# Patient Record
Sex: Female | Born: 1955 | Race: White | Hispanic: No | Smoking: Former smoker
Health system: Southern US, Community
[De-identification: ages and names within clinical notes are randomized; demographics above are authoritative.]

## PROBLEM LIST (undated history)

## (undated) DIAGNOSIS — J45909 Unspecified asthma, uncomplicated: Secondary | ICD-10-CM

## (undated) DIAGNOSIS — J449 Chronic obstructive pulmonary disease, unspecified: Secondary | ICD-10-CM

## (undated) DIAGNOSIS — B1920 Unspecified viral hepatitis C without hepatic coma: Secondary | ICD-10-CM

## (undated) DIAGNOSIS — K7460 Unspecified cirrhosis of liver: Secondary | ICD-10-CM

## (undated) HISTORY — PX: ABDOMINAL HYSTERECTOMY: SHX81

## (undated) HISTORY — PX: CHOLECYSTECTOMY: SHX55

## (undated) HISTORY — PX: KNEE SURGERY: SHX244

## (undated) HISTORY — PX: TONSILLECTOMY: SUR1361

---

## 2019-04-12 DIAGNOSIS — Z1331 Encounter for screening for depression: Secondary | ICD-10-CM | POA: Diagnosis not present

## 2019-04-12 DIAGNOSIS — F322 Major depressive disorder, single episode, severe without psychotic features: Secondary | ICD-10-CM | POA: Diagnosis not present

## 2019-04-12 DIAGNOSIS — Z7189 Other specified counseling: Secondary | ICD-10-CM | POA: Diagnosis not present

## 2019-04-12 DIAGNOSIS — I10 Essential (primary) hypertension: Secondary | ICD-10-CM | POA: Diagnosis not present

## 2019-04-12 DIAGNOSIS — M549 Dorsalgia, unspecified: Secondary | ICD-10-CM | POA: Diagnosis not present

## 2019-04-12 DIAGNOSIS — Z79899 Other long term (current) drug therapy: Secondary | ICD-10-CM | POA: Diagnosis not present

## 2019-04-12 DIAGNOSIS — R5383 Other fatigue: Secondary | ICD-10-CM | POA: Diagnosis not present

## 2019-04-12 DIAGNOSIS — Z1211 Encounter for screening for malignant neoplasm of colon: Secondary | ICD-10-CM | POA: Diagnosis not present

## 2019-04-12 DIAGNOSIS — J449 Chronic obstructive pulmonary disease, unspecified: Secondary | ICD-10-CM | POA: Diagnosis not present

## 2019-04-12 DIAGNOSIS — Z6823 Body mass index (BMI) 23.0-23.9, adult: Secondary | ICD-10-CM | POA: Diagnosis not present

## 2019-04-12 DIAGNOSIS — Z0000 Encounter for general adult medical examination without abnormal findings: Secondary | ICD-10-CM | POA: Diagnosis not present

## 2019-04-12 DIAGNOSIS — Z299 Encounter for prophylactic measures, unspecified: Secondary | ICD-10-CM | POA: Diagnosis not present

## 2019-04-12 DIAGNOSIS — Z1339 Encounter for screening examination for other mental health and behavioral disorders: Secondary | ICD-10-CM | POA: Diagnosis not present

## 2019-04-24 DIAGNOSIS — E2839 Other primary ovarian failure: Secondary | ICD-10-CM | POA: Diagnosis not present

## 2019-05-24 DIAGNOSIS — M810 Age-related osteoporosis without current pathological fracture: Secondary | ICD-10-CM | POA: Diagnosis not present

## 2019-05-24 DIAGNOSIS — Z299 Encounter for prophylactic measures, unspecified: Secondary | ICD-10-CM | POA: Diagnosis not present

## 2019-05-24 DIAGNOSIS — I10 Essential (primary) hypertension: Secondary | ICD-10-CM | POA: Diagnosis not present

## 2019-05-24 DIAGNOSIS — J449 Chronic obstructive pulmonary disease, unspecified: Secondary | ICD-10-CM | POA: Diagnosis not present

## 2019-05-24 DIAGNOSIS — Z6823 Body mass index (BMI) 23.0-23.9, adult: Secondary | ICD-10-CM | POA: Diagnosis not present

## 2019-05-24 DIAGNOSIS — F322 Major depressive disorder, single episode, severe without psychotic features: Secondary | ICD-10-CM | POA: Diagnosis not present

## 2019-07-29 DIAGNOSIS — Z23 Encounter for immunization: Secondary | ICD-10-CM | POA: Diagnosis not present

## 2019-08-06 DIAGNOSIS — M549 Dorsalgia, unspecified: Secondary | ICD-10-CM | POA: Diagnosis not present

## 2019-08-06 DIAGNOSIS — Z79899 Other long term (current) drug therapy: Secondary | ICD-10-CM | POA: Diagnosis not present

## 2019-08-06 DIAGNOSIS — J449 Chronic obstructive pulmonary disease, unspecified: Secondary | ICD-10-CM | POA: Diagnosis not present

## 2019-08-06 DIAGNOSIS — Z299 Encounter for prophylactic measures, unspecified: Secondary | ICD-10-CM | POA: Diagnosis not present

## 2019-08-06 DIAGNOSIS — Z6825 Body mass index (BMI) 25.0-25.9, adult: Secondary | ICD-10-CM | POA: Diagnosis not present

## 2019-08-06 DIAGNOSIS — I10 Essential (primary) hypertension: Secondary | ICD-10-CM | POA: Diagnosis not present

## 2019-10-05 DIAGNOSIS — R413 Other amnesia: Secondary | ICD-10-CM | POA: Diagnosis not present

## 2019-10-05 DIAGNOSIS — R42 Dizziness and giddiness: Secondary | ICD-10-CM | POA: Diagnosis not present

## 2019-11-05 DIAGNOSIS — R42 Dizziness and giddiness: Secondary | ICD-10-CM | POA: Diagnosis not present

## 2019-11-08 DIAGNOSIS — Z299 Encounter for prophylactic measures, unspecified: Secondary | ICD-10-CM | POA: Diagnosis not present

## 2019-11-08 DIAGNOSIS — Z6825 Body mass index (BMI) 25.0-25.9, adult: Secondary | ICD-10-CM | POA: Diagnosis not present

## 2019-11-08 DIAGNOSIS — R413 Other amnesia: Secondary | ICD-10-CM | POA: Diagnosis not present

## 2019-11-08 DIAGNOSIS — M549 Dorsalgia, unspecified: Secondary | ICD-10-CM | POA: Diagnosis not present

## 2019-11-08 DIAGNOSIS — J449 Chronic obstructive pulmonary disease, unspecified: Secondary | ICD-10-CM | POA: Diagnosis not present

## 2019-11-08 DIAGNOSIS — I10 Essential (primary) hypertension: Secondary | ICD-10-CM | POA: Diagnosis not present

## 2019-11-08 DIAGNOSIS — F322 Major depressive disorder, single episode, severe without psychotic features: Secondary | ICD-10-CM | POA: Diagnosis not present

## 2019-11-20 DIAGNOSIS — Z299 Encounter for prophylactic measures, unspecified: Secondary | ICD-10-CM | POA: Diagnosis not present

## 2019-11-20 DIAGNOSIS — I10 Essential (primary) hypertension: Secondary | ICD-10-CM | POA: Diagnosis not present

## 2019-11-20 DIAGNOSIS — G2581 Restless legs syndrome: Secondary | ICD-10-CM | POA: Diagnosis not present

## 2019-11-20 DIAGNOSIS — F322 Major depressive disorder, single episode, severe without psychotic features: Secondary | ICD-10-CM | POA: Diagnosis not present

## 2019-11-20 DIAGNOSIS — Z6826 Body mass index (BMI) 26.0-26.9, adult: Secondary | ICD-10-CM | POA: Diagnosis not present

## 2019-11-20 DIAGNOSIS — R42 Dizziness and giddiness: Secondary | ICD-10-CM | POA: Diagnosis not present

## 2019-11-20 DIAGNOSIS — J449 Chronic obstructive pulmonary disease, unspecified: Secondary | ICD-10-CM | POA: Diagnosis not present

## 2019-12-19 DIAGNOSIS — Z23 Encounter for immunization: Secondary | ICD-10-CM | POA: Diagnosis not present

## 2019-12-25 DIAGNOSIS — J440 Chronic obstructive pulmonary disease with acute lower respiratory infection: Secondary | ICD-10-CM | POA: Diagnosis not present

## 2019-12-25 DIAGNOSIS — J209 Acute bronchitis, unspecified: Secondary | ICD-10-CM | POA: Diagnosis not present

## 2019-12-25 DIAGNOSIS — Z6826 Body mass index (BMI) 26.0-26.9, adult: Secondary | ICD-10-CM | POA: Diagnosis not present

## 2019-12-25 DIAGNOSIS — I10 Essential (primary) hypertension: Secondary | ICD-10-CM | POA: Diagnosis not present

## 2019-12-25 DIAGNOSIS — Z299 Encounter for prophylactic measures, unspecified: Secondary | ICD-10-CM | POA: Diagnosis not present

## 2020-01-14 DIAGNOSIS — J449 Chronic obstructive pulmonary disease, unspecified: Secondary | ICD-10-CM | POA: Diagnosis not present

## 2020-01-14 DIAGNOSIS — H8110 Benign paroxysmal vertigo, unspecified ear: Secondary | ICD-10-CM | POA: Diagnosis not present

## 2020-01-14 DIAGNOSIS — Z299 Encounter for prophylactic measures, unspecified: Secondary | ICD-10-CM | POA: Diagnosis not present

## 2020-01-14 DIAGNOSIS — I10 Essential (primary) hypertension: Secondary | ICD-10-CM | POA: Diagnosis not present

## 2020-01-16 DIAGNOSIS — Z23 Encounter for immunization: Secondary | ICD-10-CM | POA: Diagnosis not present

## 2020-01-29 DIAGNOSIS — D0472 Carcinoma in situ of skin of left lower limb, including hip: Secondary | ICD-10-CM | POA: Diagnosis not present

## 2020-01-29 DIAGNOSIS — L814 Other melanin hyperpigmentation: Secondary | ICD-10-CM | POA: Diagnosis not present

## 2020-01-29 DIAGNOSIS — L821 Other seborrheic keratosis: Secondary | ICD-10-CM | POA: Diagnosis not present

## 2020-01-29 DIAGNOSIS — D485 Neoplasm of uncertain behavior of skin: Secondary | ICD-10-CM | POA: Diagnosis not present

## 2020-02-07 DIAGNOSIS — C44729 Squamous cell carcinoma of skin of left lower limb, including hip: Secondary | ICD-10-CM | POA: Diagnosis not present

## 2020-02-18 DIAGNOSIS — M549 Dorsalgia, unspecified: Secondary | ICD-10-CM | POA: Diagnosis not present

## 2020-02-18 DIAGNOSIS — Z299 Encounter for prophylactic measures, unspecified: Secondary | ICD-10-CM | POA: Diagnosis not present

## 2020-02-18 DIAGNOSIS — I10 Essential (primary) hypertension: Secondary | ICD-10-CM | POA: Diagnosis not present

## 2020-02-29 DIAGNOSIS — J449 Chronic obstructive pulmonary disease, unspecified: Secondary | ICD-10-CM | POA: Diagnosis not present

## 2020-02-29 DIAGNOSIS — R42 Dizziness and giddiness: Secondary | ICD-10-CM | POA: Diagnosis not present

## 2020-02-29 DIAGNOSIS — I10 Essential (primary) hypertension: Secondary | ICD-10-CM | POA: Diagnosis not present

## 2020-02-29 DIAGNOSIS — F322 Major depressive disorder, single episode, severe without psychotic features: Secondary | ICD-10-CM | POA: Diagnosis not present

## 2020-02-29 DIAGNOSIS — Z87891 Personal history of nicotine dependence: Secondary | ICD-10-CM | POA: Diagnosis not present

## 2020-02-29 DIAGNOSIS — Z299 Encounter for prophylactic measures, unspecified: Secondary | ICD-10-CM | POA: Diagnosis not present

## 2020-03-07 DIAGNOSIS — R42 Dizziness and giddiness: Secondary | ICD-10-CM | POA: Diagnosis not present

## 2020-03-14 DIAGNOSIS — R42 Dizziness and giddiness: Secondary | ICD-10-CM | POA: Diagnosis not present

## 2020-03-26 DIAGNOSIS — J440 Chronic obstructive pulmonary disease with acute lower respiratory infection: Secondary | ICD-10-CM | POA: Diagnosis not present

## 2020-03-26 DIAGNOSIS — J449 Chronic obstructive pulmonary disease, unspecified: Secondary | ICD-10-CM | POA: Diagnosis not present

## 2020-03-26 DIAGNOSIS — J209 Acute bronchitis, unspecified: Secondary | ICD-10-CM | POA: Diagnosis not present

## 2020-03-26 DIAGNOSIS — Z299 Encounter for prophylactic measures, unspecified: Secondary | ICD-10-CM | POA: Diagnosis not present

## 2020-03-26 DIAGNOSIS — Z6825 Body mass index (BMI) 25.0-25.9, adult: Secondary | ICD-10-CM | POA: Diagnosis not present

## 2020-03-26 DIAGNOSIS — F322 Major depressive disorder, single episode, severe without psychotic features: Secondary | ICD-10-CM | POA: Diagnosis not present

## 2020-04-16 DIAGNOSIS — J449 Chronic obstructive pulmonary disease, unspecified: Secondary | ICD-10-CM | POA: Diagnosis not present

## 2020-04-16 DIAGNOSIS — R5383 Other fatigue: Secondary | ICD-10-CM | POA: Diagnosis not present

## 2020-04-16 DIAGNOSIS — I10 Essential (primary) hypertension: Secondary | ICD-10-CM | POA: Diagnosis not present

## 2020-04-16 DIAGNOSIS — Z0000 Encounter for general adult medical examination without abnormal findings: Secondary | ICD-10-CM | POA: Diagnosis not present

## 2020-04-16 DIAGNOSIS — D692 Other nonthrombocytopenic purpura: Secondary | ICD-10-CM | POA: Diagnosis not present

## 2020-04-16 DIAGNOSIS — Z1211 Encounter for screening for malignant neoplasm of colon: Secondary | ICD-10-CM | POA: Diagnosis not present

## 2020-04-16 DIAGNOSIS — Z299 Encounter for prophylactic measures, unspecified: Secondary | ICD-10-CM | POA: Diagnosis not present

## 2020-04-16 DIAGNOSIS — Z1339 Encounter for screening examination for other mental health and behavioral disorders: Secondary | ICD-10-CM | POA: Diagnosis not present

## 2020-04-16 DIAGNOSIS — Z7189 Other specified counseling: Secondary | ICD-10-CM | POA: Diagnosis not present

## 2020-04-16 DIAGNOSIS — Z1331 Encounter for screening for depression: Secondary | ICD-10-CM | POA: Diagnosis not present

## 2020-04-16 DIAGNOSIS — Z6826 Body mass index (BMI) 26.0-26.9, adult: Secondary | ICD-10-CM | POA: Diagnosis not present

## 2020-07-17 DIAGNOSIS — M549 Dorsalgia, unspecified: Secondary | ICD-10-CM | POA: Diagnosis not present

## 2020-07-17 DIAGNOSIS — J449 Chronic obstructive pulmonary disease, unspecified: Secondary | ICD-10-CM | POA: Diagnosis not present

## 2020-07-17 DIAGNOSIS — R413 Other amnesia: Secondary | ICD-10-CM | POA: Diagnosis not present

## 2020-07-17 DIAGNOSIS — I10 Essential (primary) hypertension: Secondary | ICD-10-CM | POA: Diagnosis not present

## 2020-07-17 DIAGNOSIS — Z299 Encounter for prophylactic measures, unspecified: Secondary | ICD-10-CM | POA: Diagnosis not present

## 2020-08-05 ENCOUNTER — Other Ambulatory Visit (HOSPITAL_COMMUNITY): Payer: Self-pay | Admitting: Internal Medicine

## 2020-08-05 DIAGNOSIS — Z299 Encounter for prophylactic measures, unspecified: Secondary | ICD-10-CM | POA: Diagnosis not present

## 2020-08-05 DIAGNOSIS — I739 Peripheral vascular disease, unspecified: Secondary | ICD-10-CM | POA: Diagnosis not present

## 2020-08-05 DIAGNOSIS — R1900 Intra-abdominal and pelvic swelling, mass and lump, unspecified site: Secondary | ICD-10-CM

## 2020-08-05 DIAGNOSIS — I10 Essential (primary) hypertension: Secondary | ICD-10-CM | POA: Diagnosis not present

## 2020-08-05 DIAGNOSIS — R600 Localized edema: Secondary | ICD-10-CM

## 2020-08-05 DIAGNOSIS — M7989 Other specified soft tissue disorders: Secondary | ICD-10-CM | POA: Diagnosis not present

## 2020-08-05 DIAGNOSIS — J449 Chronic obstructive pulmonary disease, unspecified: Secondary | ICD-10-CM | POA: Diagnosis not present

## 2020-08-06 ENCOUNTER — Other Ambulatory Visit (HOSPITAL_COMMUNITY): Payer: Self-pay | Admitting: Internal Medicine

## 2020-08-06 ENCOUNTER — Other Ambulatory Visit: Payer: Self-pay

## 2020-08-06 ENCOUNTER — Ambulatory Visit (HOSPITAL_COMMUNITY)
Admission: RE | Admit: 2020-08-06 | Discharge: 2020-08-06 | Disposition: A | Discharge status: Home / Self Care | Payer: Medicare Other | Source: Ambulatory Visit | Attending: Internal Medicine | Admitting: Internal Medicine

## 2020-08-06 DIAGNOSIS — R188 Other ascites: Secondary | ICD-10-CM

## 2020-08-06 DIAGNOSIS — R600 Localized edema: Secondary | ICD-10-CM | POA: Diagnosis not present

## 2020-08-06 DIAGNOSIS — R1900 Intra-abdominal and pelvic swelling, mass and lump, unspecified site: Secondary | ICD-10-CM

## 2020-08-14 ENCOUNTER — Ambulatory Visit (HOSPITAL_COMMUNITY): Admission: RE | Admit: 2020-08-14 | Payer: Medicare Other | Source: Ambulatory Visit

## 2020-08-14 ENCOUNTER — Encounter (HOSPITAL_COMMUNITY): Payer: Self-pay

## 2020-09-17 ENCOUNTER — Ambulatory Visit (HOSPITAL_COMMUNITY)
Admission: RE | Admit: 2020-09-17 | Discharge: 2020-09-17 | Disposition: A | Discharge status: Home / Self Care | Payer: Medicare Other | Source: Ambulatory Visit | Attending: Internal Medicine | Admitting: Internal Medicine

## 2020-09-17 ENCOUNTER — Other Ambulatory Visit: Payer: Self-pay

## 2020-09-17 ENCOUNTER — Other Ambulatory Visit (HOSPITAL_COMMUNITY): Payer: Self-pay | Admitting: Internal Medicine

## 2020-09-17 DIAGNOSIS — R1900 Intra-abdominal and pelvic swelling, mass and lump, unspecified site: Secondary | ICD-10-CM

## 2020-09-17 DIAGNOSIS — R188 Other ascites: Secondary | ICD-10-CM | POA: Insufficient documentation

## 2020-09-17 DIAGNOSIS — N200 Calculus of kidney: Secondary | ICD-10-CM | POA: Diagnosis not present

## 2020-10-14 ENCOUNTER — Emergency Department (HOSPITAL_COMMUNITY)
Admission: EM | Admit: 2020-10-14 | Discharge: 2020-10-14 | Disposition: A | Discharge status: Home / Self Care | Payer: Medicare Other | Attending: Emergency Medicine | Admitting: Emergency Medicine

## 2020-10-14 ENCOUNTER — Emergency Department (HOSPITAL_COMMUNITY): Payer: Medicare Other

## 2020-10-14 ENCOUNTER — Encounter (HOSPITAL_COMMUNITY): Payer: Self-pay | Admitting: Emergency Medicine

## 2020-10-14 ENCOUNTER — Other Ambulatory Visit: Payer: Self-pay

## 2020-10-14 DIAGNOSIS — J449 Chronic obstructive pulmonary disease, unspecified: Secondary | ICD-10-CM | POA: Diagnosis not present

## 2020-10-14 DIAGNOSIS — S29001A Unspecified injury of muscle and tendon of front wall of thorax, initial encounter: Secondary | ICD-10-CM | POA: Insufficient documentation

## 2020-10-14 DIAGNOSIS — R9431 Abnormal electrocardiogram [ECG] [EKG]: Secondary | ICD-10-CM | POA: Insufficient documentation

## 2020-10-14 DIAGNOSIS — R0789 Other chest pain: Secondary | ICD-10-CM | POA: Diagnosis not present

## 2020-10-14 DIAGNOSIS — R079 Chest pain, unspecified: Secondary | ICD-10-CM | POA: Diagnosis not present

## 2020-10-14 DIAGNOSIS — T1490XA Injury, unspecified, initial encounter: Secondary | ICD-10-CM

## 2020-10-14 DIAGNOSIS — W010XXA Fall on same level from slipping, tripping and stumbling without subsequent striking against object, initial encounter: Secondary | ICD-10-CM | POA: Diagnosis not present

## 2020-10-14 DIAGNOSIS — Z87891 Personal history of nicotine dependence: Secondary | ICD-10-CM | POA: Insufficient documentation

## 2020-10-14 DIAGNOSIS — S299XXA Unspecified injury of thorax, initial encounter: Secondary | ICD-10-CM | POA: Diagnosis not present

## 2020-10-14 HISTORY — DX: Unspecified cirrhosis of liver: K74.60

## 2020-10-14 HISTORY — DX: Chronic obstructive pulmonary disease, unspecified: J44.9

## 2020-10-14 HISTORY — DX: Unspecified viral hepatitis C without hepatic coma: B19.20

## 2020-10-14 LAB — BASIC METABOLIC PANEL
Anion gap: 9 (ref 5–15)
BUN: 14 mg/dL (ref 8–23)
CO2: 27 mmol/L (ref 22–32)
Calcium: 9.3 mg/dL (ref 8.9–10.3)
Chloride: 99 mmol/L (ref 98–111)
Creatinine, Ser: 0.99 mg/dL (ref 0.44–1.00)
GFR, Estimated: >60 mL/min (ref >60)
Glucose, Bld: 111 mg/dL — ABNORMAL HIGH (ref 70–99)
Potassium: 3.3 mmol/L — ABNORMAL LOW (ref 3.5–5.1)
Sodium: 135 mmol/L (ref 135–145)

## 2020-10-14 LAB — CBC
HCT: 36.9 % (ref 36.0–46.0)
Hemoglobin: 10.5 g/dL — ABNORMAL LOW (ref 12.0–15.0)
MCH: 22.1 pg — ABNORMAL LOW (ref 26.0–34.0)
MCHC: 28.5 g/dL — ABNORMAL LOW (ref 30.0–36.0)
MCV: 77.7 fL — ABNORMAL LOW (ref 80.0–100.0)
Platelets: 214 10*3/uL (ref 150–400)
RBC: 4.75 MIL/uL (ref 3.87–5.11)
RDW: 17.7 % — ABNORMAL HIGH (ref 11.5–15.5)
WBC: 6.9 10*3/uL (ref 4.0–10.5)
nRBC: 0 % (ref 0.0–0.2)

## 2020-10-14 LAB — TROPONIN I (HIGH SENSITIVITY): Troponin I (High Sensitivity): 4 ng/L (ref <18)

## 2020-10-14 MED ORDER — ACETAMINOPHEN 500 MG PO TABS
1000.0000 mg | ORAL_TABLET | Freq: Once | ORAL | Status: AC
  Administered 2020-10-14: 21:00:00 1000 mg via ORAL
  Filled 2020-10-14: qty 2

## 2020-10-14 MED ORDER — LIDOCAINE 5 % EX PTCH: 1.0000 | MEDICATED_PATCH | CUTANEOUS | 0 refills | Status: AC

## 2020-10-14 NOTE — ED Notes (Signed)
Pt in x-ray at this time

## 2020-10-14 NOTE — ED Notes (Signed)
Lab at bedside at this time.  

## 2020-10-14 NOTE — Discharge Instructions (Signed)
You were evaluated in the Emergency Department and after careful evaluation, we did not find any emergent condition requiring admission or further testing in the hospital.  Your exam/testing today is overall reassuring.  No evidence of broken ribs for heart attack.  Your pain seems to be due to bruising or muscle strain from the fall.  We recommend Tylenol or Motrin for discomfort.  You can use the numbing patches provided as well.  Please return to the Emergency Department if you experience any worsening of your condition.   Thank you for allowing [place] to be a part of your care.

## 2020-10-14 NOTE — ED Provider Notes (Signed)
[facility] Emergency Department Provider Note MRN:  [id number]  Arrival date & time: 10/14/20     Chief Complaint   Chest Pain   History of Present Illness   Kaitlin Solis is a 63 y.o. year-old female with a history of COPD presenting to the ED with chief complaint of chest.  Patient explains that she tripped and fell 2 or 3 days ago and since then has had some soreness to the center of her chest.  They became much worse earlier today after a cough.  Denies dizziness or diaphoresis, no nausea vomiting, no shortness of breath.  No fever or significant cough recently.  Review of Systems  A complete 10 system review of systems was obtained and all systems are negative except as noted in the HPI and PMH.   Patient's Health History    Past Medical History:  Diagnosis Date   Cirrhosis of liver ([place])    pt states b/c of hep C   COPD (chronic obstructive pulmonary disease) ([place])    Hepatitis C     Past Surgical History:  Procedure Laterality Date   ABDOMINAL HYSTERECTOMY     CHOLECYSTECTOMY     TONSILLECTOMY      History reviewed. No pertinent family history.  Social History   Socioeconomic History   Marital status: Unknown    Spouse name: Not on file   Number of children: Not on file   Years of education: Not on file   Highest education level: Not on file  Occupational History   Not on file  Tobacco Use   Smoking status: Former Smoker    Years: 40.00    Quit date: 10/14/2014    Years since quitting: 6.0   Smokeless tobacco: Never Used  Vaping Use   Vaping Use: Never used  Substance and Sexual Activity   Alcohol use: Never   Drug use: Never   Sexual activity: Not on file  Other Topics Concern   Not on file  Social History Narrative   Not on file   Social Determinants of Health   Financial Resource Strain: Not on file  Food Insecurity: Not on file  Transportation Needs: Not on file  Physical Activity: Not on file   Stress: Not on file  Social Connections: Not on file  Intimate Partner Violence: Not on file     Physical Exam   Vitals:   10/14/20 1956  BP: (!) 142/62  Pulse: 60  Resp: 17  Temp: 98.4 F (36.9 C)  SpO2: 97%    CONSTITUTIONAL: Chronically ill-appearing, NAD NEURO:  Alert and oriented x 3, no focal deficits EYES:  eyes equal and reactive ENT/NECK:  no LAD, no JVD CARDIO: Regular rate, well-perfused, normal S1 and S2 PULM:  CTAB no wheezing or rhonchi GI/GU:  normal bowel sounds, non-distended, non-tender MSK/SPINE:  No gross deformities, no edema SKIN:  no rash, atraumatic PSYCH:  Appropriate speech and behavior  *Additional and/or pertinent findings included in MDM below  Diagnostic and Interventional Summary    EKG Interpretation  Date/Time:  Tuesday October 14 2020 20:03:31 EST Ventricular Rate:  60 PR Interval:  132 QRS Duration: 84 QT Interval:  440 QTC Calculation: 440 R Axis:   89 Text Interpretation: Normal sinus rhythm Cannot rule out Inferior infarct , age undetermined Abnormal ECG Confirmed by Gerlene Fee [phone]) on 10/14/2020 8:32:13 PM      Labs Reviewed  CBC - Abnormal; Notable for the following components:  Result Value   Hemoglobin 10.5 (*)    MCV 77.7 (*)    MCH 22.1 (*)    MCHC 28.5 (*)    RDW 17.7 (*)    All other components within normal limits  BASIC METABOLIC PANEL - Abnormal; Notable for the following components:   Potassium 3.3 (*)    Glucose, Bld 111 (*)    All other components within normal limits  TROPONIN I (HIGH SENSITIVITY)    DG Chest 2 View  Final Result      Medications  acetaminophen (TYLENOL) tablet 1,000 mg (1,000 mg Oral Given 10/14/20 2121)     Procedures  /  Critical Care Procedures  ED Course and Medical Decision Making  I have reviewed the triage vital signs, the nursing notes, and pertinent available records from the EMR.  Listed above are laboratory and imaging tests that I personally ordered,  reviewed, and interpreted and then considered in my medical decision making (see below for details).  Likely musculoskeletal chest pain related to fall recently, x-ray to exclude pneumothorax.  EKG does show some nonspecific abnormalities and so will screen with troponin as well.     Work-up reassuring, appropriate for discharge.  Barth Kirks. Sedonia Small, MD [place] [email or web address]  Final Clinical Impressions(s) / ED Diagnoses     ICD-10-CM   1. Trauma  T14.90XA DG Chest 2 View    DG Chest 2 View    ED Discharge Orders         Ordered    lidocaine (LIDODERM) 5 %  Every 24 hours        10/14/20 2303           Discharge Instructions Discussed with and Provided to Patient:     Discharge Instructions     You were evaluated in the Emergency Department and after careful evaluation, we did not find any emergent condition requiring admission or further testing in the hospital.  Your exam/testing today is overall reassuring.  No evidence of broken ribs for heart attack.  Your pain seems to be due to bruising or muscle strain from the fall.  We recommend Tylenol or Motrin for discomfort.  You can use the numbing patches provided as well.  Please return to the Emergency Department if you experience any worsening of your condition.   Thank you for allowing [place] to be a part of your care.       Maudie Flakes, MD 10/14/20 [phone]

## 2020-10-14 NOTE — ED Notes (Signed)
Entered room and introduced self to patient. Pt appears to be resting in bed, respirations are even and unlabored with equal chest rise and fall. Bed is locked in the lowest position, side rails x2, call bell within reach. Pt educated on call light use and hourly rounding, verbalized understanding and in agreement at this time. All questions and concerns voiced addressed. Refreshments offered and provided per patient request.  Will continue to monitor.   

## 2020-10-14 NOTE — ED Triage Notes (Signed)
Pt c/o a fall on the 9th. Denies hitting head or LOC. Pt states she fell and hit her mid chest area and the pain won't go away.

## 2020-11-14 DIAGNOSIS — R413 Other amnesia: Secondary | ICD-10-CM | POA: Diagnosis not present

## 2020-11-14 DIAGNOSIS — J449 Chronic obstructive pulmonary disease, unspecified: Secondary | ICD-10-CM | POA: Diagnosis not present

## 2020-11-14 DIAGNOSIS — J440 Chronic obstructive pulmonary disease with acute lower respiratory infection: Secondary | ICD-10-CM | POA: Diagnosis not present

## 2020-11-14 DIAGNOSIS — I10 Essential (primary) hypertension: Secondary | ICD-10-CM | POA: Diagnosis not present

## 2020-11-14 DIAGNOSIS — Z299 Encounter for prophylactic measures, unspecified: Secondary | ICD-10-CM | POA: Diagnosis not present

## 2020-12-04 DIAGNOSIS — M7989 Other specified soft tissue disorders: Secondary | ICD-10-CM | POA: Diagnosis not present

## 2020-12-04 DIAGNOSIS — I10 Essential (primary) hypertension: Secondary | ICD-10-CM | POA: Diagnosis not present

## 2020-12-04 DIAGNOSIS — L03116 Cellulitis of left lower limb: Secondary | ICD-10-CM | POA: Diagnosis not present

## 2020-12-04 DIAGNOSIS — J449 Chronic obstructive pulmonary disease, unspecified: Secondary | ICD-10-CM | POA: Diagnosis not present

## 2020-12-04 DIAGNOSIS — Z299 Encounter for prophylactic measures, unspecified: Secondary | ICD-10-CM | POA: Diagnosis not present

## 2020-12-15 DIAGNOSIS — M818 Other osteoporosis without current pathological fracture: Secondary | ICD-10-CM | POA: Diagnosis not present

## 2020-12-15 DIAGNOSIS — I83812 Varicose veins of left lower extremities with pain: Secondary | ICD-10-CM | POA: Diagnosis not present

## 2020-12-23 DIAGNOSIS — M4726 Other spondylosis with radiculopathy, lumbar region: Secondary | ICD-10-CM | POA: Diagnosis not present

## 2021-02-25 DIAGNOSIS — J440 Chronic obstructive pulmonary disease with acute lower respiratory infection: Secondary | ICD-10-CM | POA: Diagnosis not present

## 2021-02-25 DIAGNOSIS — Z299 Encounter for prophylactic measures, unspecified: Secondary | ICD-10-CM | POA: Diagnosis not present

## 2021-02-25 DIAGNOSIS — J449 Chronic obstructive pulmonary disease, unspecified: Secondary | ICD-10-CM | POA: Diagnosis not present

## 2021-02-25 DIAGNOSIS — I10 Essential (primary) hypertension: Secondary | ICD-10-CM | POA: Diagnosis not present

## 2021-02-25 DIAGNOSIS — J209 Acute bronchitis, unspecified: Secondary | ICD-10-CM | POA: Diagnosis not present

## 2021-03-26 DIAGNOSIS — I10 Essential (primary) hypertension: Secondary | ICD-10-CM | POA: Diagnosis not present

## 2021-03-26 DIAGNOSIS — L03115 Cellulitis of right lower limb: Secondary | ICD-10-CM | POA: Diagnosis not present

## 2021-03-26 DIAGNOSIS — M7752 Other enthesopathy of left foot: Secondary | ICD-10-CM | POA: Diagnosis not present

## 2021-03-26 DIAGNOSIS — Z299 Encounter for prophylactic measures, unspecified: Secondary | ICD-10-CM | POA: Diagnosis not present

## 2021-04-03 DIAGNOSIS — Z299 Encounter for prophylactic measures, unspecified: Secondary | ICD-10-CM | POA: Diagnosis not present

## 2021-04-03 DIAGNOSIS — I10 Essential (primary) hypertension: Secondary | ICD-10-CM | POA: Diagnosis not present

## 2021-04-03 DIAGNOSIS — M79604 Pain in right leg: Secondary | ICD-10-CM | POA: Diagnosis not present

## 2021-04-03 DIAGNOSIS — M79661 Pain in right lower leg: Secondary | ICD-10-CM | POA: Diagnosis not present

## 2021-04-03 DIAGNOSIS — L97921 Non-pressure chronic ulcer of unspecified part of left lower leg limited to breakdown of skin: Secondary | ICD-10-CM | POA: Diagnosis not present

## 2021-04-09 DIAGNOSIS — R52 Pain, unspecified: Secondary | ICD-10-CM | POA: Diagnosis not present

## 2021-04-09 DIAGNOSIS — R609 Edema, unspecified: Secondary | ICD-10-CM | POA: Diagnosis not present

## 2021-04-17 DIAGNOSIS — M5410 Radiculopathy, site unspecified: Secondary | ICD-10-CM | POA: Diagnosis not present

## 2021-04-17 DIAGNOSIS — M4726 Other spondylosis with radiculopathy, lumbar region: Secondary | ICD-10-CM | POA: Diagnosis not present

## 2021-04-21 DIAGNOSIS — M545 Low back pain, unspecified: Secondary | ICD-10-CM | POA: Diagnosis not present

## 2021-04-30 DIAGNOSIS — M4726 Other spondylosis with radiculopathy, lumbar region: Secondary | ICD-10-CM | POA: Diagnosis not present

## 2021-05-05 DIAGNOSIS — M21371 Foot drop, right foot: Secondary | ICD-10-CM | POA: Diagnosis not present

## 2021-05-05 DIAGNOSIS — M5416 Radiculopathy, lumbar region: Secondary | ICD-10-CM | POA: Diagnosis not present

## 2021-05-13 ENCOUNTER — Other Ambulatory Visit: Payer: Self-pay | Admitting: Student

## 2021-05-13 ENCOUNTER — Other Ambulatory Visit (HOSPITAL_COMMUNITY): Payer: Self-pay | Admitting: Student

## 2021-05-13 DIAGNOSIS — R109 Unspecified abdominal pain: Secondary | ICD-10-CM

## 2021-05-13 DIAGNOSIS — R5383 Other fatigue: Secondary | ICD-10-CM | POA: Diagnosis not present

## 2021-05-13 DIAGNOSIS — Z79899 Other long term (current) drug therapy: Secondary | ICD-10-CM | POA: Diagnosis not present

## 2021-05-13 DIAGNOSIS — Z87891 Personal history of nicotine dependence: Secondary | ICD-10-CM | POA: Diagnosis not present

## 2021-05-13 DIAGNOSIS — Z0000 Encounter for general adult medical examination without abnormal findings: Secondary | ICD-10-CM | POA: Diagnosis not present

## 2021-05-13 DIAGNOSIS — Z299 Encounter for prophylactic measures, unspecified: Secondary | ICD-10-CM | POA: Diagnosis not present

## 2021-05-13 DIAGNOSIS — Z7189 Other specified counseling: Secondary | ICD-10-CM | POA: Diagnosis not present

## 2021-05-13 DIAGNOSIS — I10 Essential (primary) hypertension: Secondary | ICD-10-CM | POA: Diagnosis not present

## 2021-05-14 DIAGNOSIS — G5731 Lesion of lateral popliteal nerve, right lower limb: Secondary | ICD-10-CM | POA: Diagnosis not present

## 2021-05-14 DIAGNOSIS — M5416 Radiculopathy, lumbar region: Secondary | ICD-10-CM | POA: Diagnosis not present

## 2021-05-20 ENCOUNTER — Encounter (HOSPITAL_COMMUNITY): Payer: Self-pay

## 2021-05-20 ENCOUNTER — Ambulatory Visit (HOSPITAL_COMMUNITY): Admission: RE | Admit: 2021-05-20 | Payer: Medicare Other | Source: Ambulatory Visit

## 2021-05-21 DIAGNOSIS — G5731 Lesion of lateral popliteal nerve, right lower limb: Secondary | ICD-10-CM | POA: Diagnosis not present

## 2021-05-21 DIAGNOSIS — M48061 Spinal stenosis, lumbar region without neurogenic claudication: Secondary | ICD-10-CM | POA: Diagnosis not present

## 2021-05-21 DIAGNOSIS — G629 Polyneuropathy, unspecified: Secondary | ICD-10-CM | POA: Diagnosis not present

## 2021-05-21 DIAGNOSIS — M5416 Radiculopathy, lumbar region: Secondary | ICD-10-CM | POA: Diagnosis not present

## 2021-05-27 DIAGNOSIS — I739 Peripheral vascular disease, unspecified: Secondary | ICD-10-CM | POA: Diagnosis not present

## 2021-05-27 DIAGNOSIS — Z79899 Other long term (current) drug therapy: Secondary | ICD-10-CM | POA: Diagnosis not present

## 2021-05-27 DIAGNOSIS — I10 Essential (primary) hypertension: Secondary | ICD-10-CM | POA: Diagnosis not present

## 2021-05-27 DIAGNOSIS — M79604 Pain in right leg: Secondary | ICD-10-CM | POA: Diagnosis not present

## 2021-05-27 DIAGNOSIS — M549 Dorsalgia, unspecified: Secondary | ICD-10-CM | POA: Diagnosis not present

## 2021-05-27 DIAGNOSIS — Z299 Encounter for prophylactic measures, unspecified: Secondary | ICD-10-CM | POA: Diagnosis not present

## 2021-06-01 HISTORY — PX: BACK SURGERY: SHX140

## 2021-06-11 DIAGNOSIS — I10 Essential (primary) hypertension: Secondary | ICD-10-CM | POA: Diagnosis not present

## 2021-06-11 DIAGNOSIS — M5416 Radiculopathy, lumbar region: Secondary | ICD-10-CM | POA: Diagnosis not present

## 2021-06-11 DIAGNOSIS — M7061 Trochanteric bursitis, right hip: Secondary | ICD-10-CM | POA: Diagnosis not present

## 2021-07-03 DIAGNOSIS — M79672 Pain in left foot: Secondary | ICD-10-CM | POA: Diagnosis not present

## 2021-08-03 ENCOUNTER — Other Ambulatory Visit: Payer: Self-pay | Admitting: Internal Medicine

## 2021-08-03 ENCOUNTER — Ambulatory Visit
Admission: RE | Admit: 2021-08-03 | Discharge: 2021-08-03 | Disposition: A | Discharge status: Home / Self Care | Payer: Medicare Other | Source: Ambulatory Visit | Attending: Internal Medicine | Admitting: Internal Medicine

## 2021-08-03 ENCOUNTER — Other Ambulatory Visit: Payer: Self-pay

## 2021-08-03 DIAGNOSIS — Z139 Encounter for screening, unspecified: Secondary | ICD-10-CM

## 2021-08-03 DIAGNOSIS — Z1231 Encounter for screening mammogram for malignant neoplasm of breast: Secondary | ICD-10-CM | POA: Diagnosis not present

## 2021-08-27 DIAGNOSIS — Z299 Encounter for prophylactic measures, unspecified: Secondary | ICD-10-CM | POA: Diagnosis not present

## 2021-08-27 DIAGNOSIS — M549 Dorsalgia, unspecified: Secondary | ICD-10-CM | POA: Diagnosis not present

## 2021-08-27 DIAGNOSIS — J449 Chronic obstructive pulmonary disease, unspecified: Secondary | ICD-10-CM | POA: Diagnosis not present

## 2021-08-27 DIAGNOSIS — I10 Essential (primary) hypertension: Secondary | ICD-10-CM | POA: Diagnosis not present

## 2021-08-27 DIAGNOSIS — Z23 Encounter for immunization: Secondary | ICD-10-CM | POA: Diagnosis not present

## 2021-08-27 DIAGNOSIS — G2581 Restless legs syndrome: Secondary | ICD-10-CM | POA: Diagnosis not present

## 2021-08-27 DIAGNOSIS — D509 Iron deficiency anemia, unspecified: Secondary | ICD-10-CM | POA: Diagnosis not present

## 2021-09-03 ENCOUNTER — Encounter: Payer: Self-pay | Admitting: Internal Medicine

## 2021-09-21 DIAGNOSIS — Z87891 Personal history of nicotine dependence: Secondary | ICD-10-CM | POA: Diagnosis not present

## 2021-09-21 DIAGNOSIS — M7552 Bursitis of left shoulder: Secondary | ICD-10-CM | POA: Diagnosis not present

## 2021-09-21 DIAGNOSIS — Z299 Encounter for prophylactic measures, unspecified: Secondary | ICD-10-CM | POA: Diagnosis not present

## 2021-09-21 DIAGNOSIS — I10 Essential (primary) hypertension: Secondary | ICD-10-CM | POA: Diagnosis not present

## 2021-10-07 DIAGNOSIS — H25813 Combined forms of age-related cataract, bilateral: Secondary | ICD-10-CM | POA: Diagnosis not present

## 2021-10-07 DIAGNOSIS — H353132 Nonexudative age-related macular degeneration, bilateral, intermediate dry stage: Secondary | ICD-10-CM | POA: Diagnosis not present

## 2021-10-08 ENCOUNTER — Ambulatory Visit: Payer: Medicare Other | Admitting: Internal Medicine

## 2021-10-13 DIAGNOSIS — H25811 Combined forms of age-related cataract, right eye: Secondary | ICD-10-CM | POA: Diagnosis not present

## 2021-10-13 DIAGNOSIS — Z01818 Encounter for other preprocedural examination: Secondary | ICD-10-CM | POA: Diagnosis not present

## 2021-10-13 DIAGNOSIS — H25812 Combined forms of age-related cataract, left eye: Secondary | ICD-10-CM | POA: Diagnosis not present

## 2021-11-04 DIAGNOSIS — H25812 Combined forms of age-related cataract, left eye: Secondary | ICD-10-CM | POA: Diagnosis not present

## 2021-11-04 DIAGNOSIS — H2512 Age-related nuclear cataract, left eye: Secondary | ICD-10-CM | POA: Diagnosis not present

## 2021-11-12 ENCOUNTER — Ambulatory Visit (INDEPENDENT_AMBULATORY_CARE_PROVIDER_SITE_OTHER): Payer: Medicare Other | Admitting: Internal Medicine

## 2021-11-12 ENCOUNTER — Encounter: Payer: Self-pay | Admitting: Internal Medicine

## 2021-11-12 ENCOUNTER — Other Ambulatory Visit: Payer: Self-pay

## 2021-11-12 ENCOUNTER — Encounter: Payer: Self-pay | Admitting: *Deleted

## 2021-11-12 ENCOUNTER — Telehealth: Payer: Self-pay | Admitting: *Deleted

## 2021-11-12 VITALS — BP 112/64 | HR 57 | Temp 97.1°F | Ht 61.0 in | Wt 142.4 lb

## 2021-11-12 DIAGNOSIS — D509 Iron deficiency anemia, unspecified: Secondary | ICD-10-CM | POA: Diagnosis not present

## 2021-11-12 DIAGNOSIS — K760 Fatty (change of) liver, not elsewhere classified: Secondary | ICD-10-CM

## 2021-11-12 DIAGNOSIS — B182 Chronic viral hepatitis C: Secondary | ICD-10-CM

## 2021-11-12 NOTE — H&P (View-Only) (Signed)
Primary Care Physician:  Glenda Chroman, MD Primary Gastroenterologist:  Dr. Abbey Chatters  Chief Complaint  Patient presents with   Anemia    Consult for tcs/egd. Had tcs approx 6 years ago in [place]. Had egd prior to tcs. ?blood in stool last week, but had ate red velvet cake    HPI:   Kaitlin Solis is a 66 y.o. female who presents to clinic today by referral from her PCP Dr. Woody Seller for evaluation for iron deficiency anemia.  Has a history of anemia going back to 2021 where her hemoglobin was 10.5.  Recent blood work on 08/27/2021 showed hemoglobin 12, platelets 192, chronically on iron supplementation.  Most recent iron studies normal.  Denies any melena hematochezia.  No abdominal pain or unintentional weight loss.  No family history of colorectal malignancy.  States her last colonoscopy was approximately 6 years ago in [place] which she states was WNL.  Also with history of hepatitis C which she states she was treated with Harvoni 2016 also in [place].  Believes that she had hep C viral load checked afterwards which ensured that she was cured.  States she contracted this from tattoo in her 41s.  She reports that she has cirrhosis.   She had an ultrasound on 09/17/2020 which showed fatty liver disease.  Past Medical History:  Diagnosis Date   Cirrhosis of liver ([place])    pt states b/c of hep C   COPD (chronic obstructive pulmonary disease) ([facility])    Hepatitis C     Past Surgical History:  Procedure Laterality Date   ABDOMINAL HYSTERECTOMY     CHOLECYSTECTOMY     TONSILLECTOMY      Current Outpatient Medications  Medication Sig Dispense Refill   albuterol (VENTOLIN HFA) 108 (90 Base) MCG/ACT inhaler Inhale 2 puffs into the lungs every 6 (six) hours as needed for wheezing or shortness of breath.     Calcium Carbonate (CALCIUM 500 PO) Take 500 mg by mouth daily.     citalopram (CELEXA) 40 MG tablet Take 40 mg by mouth at bedtime.     Fluticasone-Umeclidin-Vilant (TRELEGY ELLIPTA)  200-62.5-25 MCG/INH AEPB Inhale 1 puff into the lungs daily.     furosemide (LASIX) 20 MG tablet Take 20 mg by mouth as needed.     hydrochlorothiazide (HYDRODIURIL) 25 MG tablet Take 25 mg by mouth daily.     lisinopril (ZESTRIL) 20 MG tablet Take 20 mg by mouth daily.     Multiple Vitamin (MULTIVITAMIN WITH MINERALS) TABS tablet Take 1 tablet by mouth daily.     Multiple Vitamins-Minerals (PRESERVISION AREDS PO) Take by mouth 2 (two) times daily.     rOPINIRole (REQUIP) 1 MG tablet Take 1 mg by mouth 3 (three) times daily.     traMADol (ULTRAM) 50 MG tablet Take 50 mg by mouth 3 (three) times daily as needed.     traZODone (DESYREL) 50 MG tablet Take 50 mg by mouth at bedtime.     vitamin B-12 (CYANOCOBALAMIN) 500 MCG tablet Take 500 mcg by mouth daily.     lidocaine (LIDODERM) 5 % Place 1 patch onto the skin daily. Remove & Discard patch within 12 hours or as directed by MD (Patient not taking: Reported on 11/12/2021) 5 patch 0   No current facility-administered medications for this visit.    Allergies as of 11/12/2021   (No Known Allergies)    Family History  Problem Relation Age of Onset   Breast cancer Neg Hx  Social History   Socioeconomic History   Marital status: Unknown    Spouse name: Not on file   Number of children: Not on file   Years of education: Not on file   Highest education level: Not on file  Occupational History   Not on file  Tobacco Use   Smoking status: Former    Years: 40.00    Types: Cigarettes    Quit date: 10/14/2014    Years since quitting: 7.0   Smokeless tobacco: Never  Vaping Use   Vaping Use: Never used  Substance and Sexual Activity   Alcohol use: Never   Drug use: Never   Sexual activity: Not on file  Other Topics Concern   Not on file  Social History Narrative   Not on file   Social Determinants of Health   Financial Resource Strain: Not on file  Food Insecurity: Not on file  Transportation Needs: Not on file  Physical  Activity: Not on file  Stress: Not on file  Social Connections: Not on file  Intimate Partner Violence: Not on file    Subjective: Review of Systems  Constitutional:  Negative for chills and fever.  HENT:  Negative for congestion and hearing loss.   Eyes:  Negative for blurred vision and double vision.  Respiratory:  Negative for cough and shortness of breath.   Cardiovascular:  Negative for chest pain and palpitations.  Gastrointestinal:  Negative for abdominal pain, blood in stool, constipation, diarrhea, heartburn, melena and vomiting.  Genitourinary:  Negative for dysuria and urgency.  Musculoskeletal:  Negative for joint pain and myalgias.  Skin:  Negative for itching and rash.  Neurological:  Negative for dizziness and headaches.  Psychiatric/Behavioral:  Negative for depression. The patient is not nervous/anxious.       Objective: BP 112/64    Pulse (!) 57    Temp (!) 97.1 F (36.2 C) (Temporal)    Ht 5\' 1"  (1.549 m)    Wt 142 lb 6.4 oz (64.6 kg)    BMI 26.91 kg/m  Physical Exam Constitutional:      Appearance: Normal appearance.  HENT:     Head: Normocephalic and atraumatic.  Eyes:     Extraocular Movements: Extraocular movements intact.     Conjunctiva/sclera: Conjunctivae normal.  Cardiovascular:     Rate and Rhythm: Normal rate and regular rhythm.  Pulmonary:     Effort: Pulmonary effort is normal.     Breath sounds: Normal breath sounds.  Abdominal:     General: Bowel sounds are normal.     Palpations: Abdomen is soft.  Musculoskeletal:        General: No swelling. Normal range of motion.     Cervical back: Normal range of motion and neck supple.  Skin:    General: Skin is warm and dry.     Coloration: Skin is not jaundiced.  Neurological:     General: No focal deficit present.     Mental Status: She is alert and oriented to person, place, and time.  Psychiatric:        Mood and Affect: Mood normal.        Behavior: Behavior normal.      Assessment: *Iron deficiency anemia *Chronic hepatitis C status posttreatment with Harvoni with reported SVR 2016 *Fatty liver disease  Plan: For patient's iron deficiency anemia, Will schedule for EGD to evaluate for peptic ulcer disease, esophagitis, gastritis, H. Pylori, duodenitis, or other. Will also evaluate for esophageal stricture, Schatzki's ring, esophageal  web or other.   At the same time where for form colonoscopy to evaluate for lower GI causes of anemia.  The risks including infection, bleed, or perforation as well as benefits, limitations, alternatives and imponderables have been reviewed with the patient. Potential for esophageal dilation, biopsy, etc. have also been reviewed.  Questions have been answered. All parties agreeable.  For her history of chronic hepatitis C and fatty liver disease, self-reported cirrhosis, will check liver ultrasound with elastography and meld labs today.  We will also check hepatitis C viral load to ensure SVR.  Follow-up in 6 months  11/12/2021 11:57 AM   Disclaimer: This note was dictated with voice recognition software. Similar sounding words can inadvertently be transcribed and may not be corrected upon review.

## 2021-11-12 NOTE — Progress Notes (Signed)
Primary Care Physician:  Glenda Chroman, MD Primary Gastroenterologist:  Dr. Abbey Chatters  Chief Complaint  Patient presents with   Anemia    Consult for tcs/egd. Had tcs approx 6 years ago in [place]. Had egd prior to tcs. ?blood in stool last week, but had ate red velvet cake    HPI:   Kaitlin Solis is a 66 y.o. female who presents to clinic today by referral from her PCP Dr. Woody Seller for evaluation for iron deficiency anemia.  Has a history of anemia going back to 2021 where her hemoglobin was 10.5.  Recent blood work on 08/27/2021 showed hemoglobin 12, platelets 192, chronically on iron supplementation.  Most recent iron studies normal.  Denies any melena hematochezia.  No abdominal pain or unintentional weight loss.  No family history of colorectal malignancy.  States her last colonoscopy was approximately 6 years ago in [place] which she states was WNL.  Also with history of hepatitis C which she states she was treated with Harvoni 2016 also in [place].  Believes that she had hep C viral load checked afterwards which ensured that she was cured.  States she contracted this from tattoo in her 12s.  She reports that she has cirrhosis.   She had an ultrasound on 09/17/2020 which showed fatty liver disease.  Past Medical History:  Diagnosis Date   Cirrhosis of liver ([place])    pt states b/c of hep C   COPD (chronic obstructive pulmonary disease) ([facility])    Hepatitis C     Past Surgical History:  Procedure Laterality Date   ABDOMINAL HYSTERECTOMY     CHOLECYSTECTOMY     TONSILLECTOMY      Current Outpatient Medications  Medication Sig Dispense Refill   albuterol (VENTOLIN HFA) 108 (90 Base) MCG/ACT inhaler Inhale 2 puffs into the lungs every 6 (six) hours as needed for wheezing or shortness of breath.     Calcium Carbonate (CALCIUM 500 PO) Take 500 mg by mouth daily.     citalopram (CELEXA) 40 MG tablet Take 40 mg by mouth at bedtime.     Fluticasone-Umeclidin-Vilant (TRELEGY ELLIPTA)  200-62.5-25 MCG/INH AEPB Inhale 1 puff into the lungs daily.     furosemide (LASIX) 20 MG tablet Take 20 mg by mouth as needed.     hydrochlorothiazide (HYDRODIURIL) 25 MG tablet Take 25 mg by mouth daily.     lisinopril (ZESTRIL) 20 MG tablet Take 20 mg by mouth daily.     Multiple Vitamin (MULTIVITAMIN WITH MINERALS) TABS tablet Take 1 tablet by mouth daily.     Multiple Vitamins-Minerals (PRESERVISION AREDS PO) Take by mouth 2 (two) times daily.     rOPINIRole (REQUIP) 1 MG tablet Take 1 mg by mouth 3 (three) times daily.     traMADol (ULTRAM) 50 MG tablet Take 50 mg by mouth 3 (three) times daily as needed.     traZODone (DESYREL) 50 MG tablet Take 50 mg by mouth at bedtime.     vitamin B-12 (CYANOCOBALAMIN) 500 MCG tablet Take 500 mcg by mouth daily.     lidocaine (LIDODERM) 5 % Place 1 patch onto the skin daily. Remove & Discard patch within 12 hours or as directed by MD (Patient not taking: Reported on 11/12/2021) 5 patch 0   No current facility-administered medications for this visit.    Allergies as of 11/12/2021   (No Known Allergies)    Family History  Problem Relation Age of Onset   Breast cancer Neg Hx  Social History   Socioeconomic History   Marital status: Unknown    Spouse name: Not on file   Number of children: Not on file   Years of education: Not on file   Highest education level: Not on file  Occupational History   Not on file  Tobacco Use   Smoking status: Former    Years: 40.00    Types: Cigarettes    Quit date: 10/14/2014    Years since quitting: 7.0   Smokeless tobacco: Never  Vaping Use   Vaping Use: Never used  Substance and Sexual Activity   Alcohol use: Never   Drug use: Never   Sexual activity: Not on file  Other Topics Concern   Not on file  Social History Narrative   Not on file   Social Determinants of Health   Financial Resource Strain: Not on file  Food Insecurity: Not on file  Transportation Needs: Not on file  Physical  Activity: Not on file  Stress: Not on file  Social Connections: Not on file  Intimate Partner Violence: Not on file    Subjective: Review of Systems  Constitutional:  Negative for chills and fever.  HENT:  Negative for congestion and hearing loss.   Eyes:  Negative for blurred vision and double vision.  Respiratory:  Negative for cough and shortness of breath.   Cardiovascular:  Negative for chest pain and palpitations.  Gastrointestinal:  Negative for abdominal pain, blood in stool, constipation, diarrhea, heartburn, melena and vomiting.  Genitourinary:  Negative for dysuria and urgency.  Musculoskeletal:  Negative for joint pain and myalgias.  Skin:  Negative for itching and rash.  Neurological:  Negative for dizziness and headaches.  Psychiatric/Behavioral:  Negative for depression. The patient is not nervous/anxious.       Objective: BP 112/64    Pulse (!) 57    Temp (!) 97.1 F (36.2 C) (Temporal)    Ht 5\' 1"  (1.549 m)    Wt 142 lb 6.4 oz (64.6 kg)    BMI 26.91 kg/m  Physical Exam Constitutional:      Appearance: Normal appearance.  HENT:     Head: Normocephalic and atraumatic.  Eyes:     Extraocular Movements: Extraocular movements intact.     Conjunctiva/sclera: Conjunctivae normal.  Cardiovascular:     Rate and Rhythm: Normal rate and regular rhythm.  Pulmonary:     Effort: Pulmonary effort is normal.     Breath sounds: Normal breath sounds.  Abdominal:     General: Bowel sounds are normal.     Palpations: Abdomen is soft.  Musculoskeletal:        General: No swelling. Normal range of motion.     Cervical back: Normal range of motion and neck supple.  Skin:    General: Skin is warm and dry.     Coloration: Skin is not jaundiced.  Neurological:     General: No focal deficit present.     Mental Status: She is alert and oriented to person, place, and time.  Psychiatric:        Mood and Affect: Mood normal.        Behavior: Behavior normal.      Assessment: *Iron deficiency anemia *Chronic hepatitis C status posttreatment with Harvoni with reported SVR 2016 *Fatty liver disease  Plan: For patient's iron deficiency anemia, Will schedule for EGD to evaluate for peptic ulcer disease, esophagitis, gastritis, H. Pylori, duodenitis, or other. Will also evaluate for esophageal stricture, Schatzki's ring, esophageal  web or other.   At the same time where for form colonoscopy to evaluate for lower GI causes of anemia.  The risks including infection, bleed, or perforation as well as benefits, limitations, alternatives and imponderables have been reviewed with the patient. Potential for esophageal dilation, biopsy, etc. have also been reviewed.  Questions have been answered. All parties agreeable.  For her history of chronic hepatitis C and fatty liver disease, self-reported cirrhosis, will check liver ultrasound with elastography and meld labs today.  We will also check hepatitis C viral load to ensure SVR.  Follow-up in 6 months  11/12/2021 11:57 AM   Disclaimer: This note was dictated with voice recognition software. Similar sounding words can inadvertently be transcribed and may not be corrected upon review.

## 2021-11-12 NOTE — Patient Instructions (Signed)
We will schedule you for upper endoscopy and colonoscopy to further evaluate your iron deficiency anemia.  For your chronic liver disease, I am going to check an ultrasound with elastography to evaluate for scarring.  I am also going to order blood work today including liver labs and hepatitis C viral load to be performed at [facility].  Follow-up with GI in 6 months.  It was very nice meeting you today.  Dr. Abbey Chatters  At [facility] we value your feedback. You may receive a survey about your visit today. Please share your experience as we strive to create trusting relationships with our patients to provide genuine, compassionate, quality care.  We appreciate your understanding and patience as we review any laboratory studies, imaging, and other diagnostic tests that are ordered as we care for you. Our office policy is 5 business days for review of these results, and any emergent or urgent results are addressed in a timely manner for your best interest. If you do not hear from our office in 1 week, please contact us.   We also encourage the use of MyChart, which contains your medical information for your review as well. If you are not enrolled in this feature, an access code is on this after visit summary for your convenience. Thank you for allowing [place] to be involved in your care.  It was great to see you today!  I hope you have a great rest of your Winter!    Kaitlin Solis. Abbey Chatters, D.O. Gastroenterology and Hepatology [facility]

## 2021-11-12 NOTE — Telephone Encounter (Signed)
Called pt to schedule TCS/EGD with propofol asa 3 with Dr. Abbey Chatters and to give [place] appt details. Called # on file x 2 and same female answered and advised me I had the wrong #. Verified I was calling the correct # listed but he stated this was not her #. No other # in chart.  Letter mailed to pt

## 2021-11-16 DIAGNOSIS — K760 Fatty (change of) liver, not elsewhere classified: Secondary | ICD-10-CM | POA: Diagnosis not present

## 2021-11-18 DIAGNOSIS — H25811 Combined forms of age-related cataract, right eye: Secondary | ICD-10-CM | POA: Diagnosis not present

## 2021-11-23 MED ORDER — PEG 3350-KCL-NA BICARB-NACL 420 G PO SOLR: ORAL | 0 refills | Status: DC

## 2021-11-23 NOTE — Telephone Encounter (Signed)
Patient returned call. She has been scheduled for 2/2 at 10:15am. Aware will send prep to pharmacy. Will send instructions to mychart with pre-op appointment.   PA approved via [facility] website. Auth# [id number], DOS: Dec 03, 2021 - Mar 03, 2022

## 2021-11-23 NOTE — Addendum Note (Signed)
Addended by: Cheron Every on: 11/23/2021 03:51 PM   Modules accepted: Orders

## 2021-11-24 ENCOUNTER — Ambulatory Visit (HOSPITAL_COMMUNITY)
Admission: RE | Admit: 2021-11-24 | Discharge: 2021-11-24 | Disposition: A | Discharge status: Home / Self Care | Payer: Medicare Other | Source: Ambulatory Visit | Attending: Internal Medicine | Admitting: Internal Medicine

## 2021-11-24 ENCOUNTER — Encounter: Payer: Self-pay | Admitting: *Deleted

## 2021-11-24 ENCOUNTER — Other Ambulatory Visit: Payer: Self-pay

## 2021-11-24 DIAGNOSIS — K760 Fatty (change of) liver, not elsewhere classified: Secondary | ICD-10-CM | POA: Diagnosis not present

## 2021-11-24 DIAGNOSIS — B182 Chronic viral hepatitis C: Secondary | ICD-10-CM | POA: Insufficient documentation

## 2021-11-24 NOTE — Telephone Encounter (Signed)
Called pt and she is aware of pre-op appointment details. She voiced understanding

## 2021-11-26 NOTE — Patient Instructions (Signed)
Kaitlin Solis  11/26/2021     @PREFPERIOPPHARMACY @   Your procedure is scheduled on  12/03/2021.   Report to [facility] at  [place]   Call this number if you have problems the morning of surgery:  [phone]   Remember:  Follow the diet and prep instructions given to you by the office.    Use your inhaler before you come and bring your rescue inhaler with you.    Take these medicines the morning of surgery with A SIP OF WATER                                           tramadol.     Do not wear jewelry, make-up or nail polish.  Do not wear lotions, powders, or perfumes, or deodorant.  Do not shave 48 hours prior to surgery.  Men may shave face and neck.  Do not bring valuables to the hospital.  [facility] is not responsible for any belongings or valuables.  Contacts, dentures or bridgework may not be worn into surgery.  Leave your suitcase in the car.  After surgery it may be brought to your room.  For patients admitted to the hospital, discharge time will be determined by your treatment team.  Patients discharged the day of surgery will not be allowed to drive home and must have someone with them for 24 hours.    Special instructions:   DO NOT smoke tobacco or vape for 24 hours before your procedure.  Please read over the following fact sheets that you were given. Anesthesia Post-op Instructions and Care and Recovery After Surgery      Upper Endoscopy, Adult, Care After This sheet gives you information about how to care for yourself after your procedure. Your health care provider may also give you more specific instructions. If you have problems or questions, contact your health care provider. What can I expect after the procedure? After the procedure, it is common to have: A sore throat. Mild stomach pain or discomfort. Bloating. Nausea. Follow these instructions at home:  Follow instructions from your health care provider about what to eat or  drink after your procedure. Return to your normal activities as told by your health care provider. Ask your health care provider what activities are safe for you. Take over-the-counter and prescription medicines only as told by your health care provider. If you were given a sedative during the procedure, it can affect you for several hours. Do not drive or operate machinery until your health care provider says that it is safe. Keep all follow-up visits as told by your health care provider. This is important. Contact a health care provider if you have: A sore throat that lasts longer than one day. Trouble swallowing. Get help right away if: You vomit blood or your vomit looks like coffee grounds. You have: A fever. Bloody, black, or tarry stools. A severe sore throat or you cannot swallow. Difficulty breathing. Severe pain in your chest or abdomen. Summary After the procedure, it is common to have a sore throat, mild stomach discomfort, bloating, and nausea. If you were given a sedative during the procedure, it can affect you for several hours. Do not drive or operate machinery until your health care provider says that it is safe. Follow instructions from your health care provider  about what to eat or drink after your procedure. Return to your normal activities as told by your health care provider. This information is not intended to replace advice given to you by your health care provider. Make sure you discuss any questions you have with your health care provider. Document Revised: 08/24/2019 Document Reviewed: 03/20/2018 Elsevier Patient Education  2022 [place]. Colonoscopy, Adult, Care After This sheet gives you information about how to care for yourself after your procedure. Your health care provider may also give you more specific instructions. If you have problems or questions, contact your health care provider. What can I expect after the procedure? After the procedure, it is  common to have: A small amount of blood in your stool for 24 hours after the procedure. Some gas. Mild cramping or bloating of your abdomen. Follow these instructions at home: Eating and drinking  Drink enough fluid to keep your urine pale yellow. Follow instructions from your health care provider about eating or drinking restrictions. Resume your normal diet as instructed by your health care provider. Avoid heavy or fried foods that are hard to digest. Activity Rest as told by your health care provider. Avoid sitting for a long time without moving. Get up to take short walks every 1-2 hours. This is important to improve blood flow and breathing. Ask for help if you feel weak or unsteady. Return to your normal activities as told by your health care provider. Ask your health care provider what activities are safe for you. Managing cramping and bloating  Try walking around when you have cramps or feel bloated. Apply heat to your abdomen as told by your health care provider. Use the heat source that your health care provider recommends, such as a moist heat pack or a heating pad. Place a towel between your skin and the heat source. Leave the heat on for 20-30 minutes. Remove the heat if your skin turns bright red. This is especially important if you are unable to feel pain, heat, or cold. You may have a greater risk of getting burned. General instructions If you were given a sedative during the procedure, it can affect you for several hours. Do not drive or operate machinery until your health care provider says that it is safe. For the first 24 hours after the procedure: Do not sign important documents. Do not drink alcohol. Do your regular daily activities at a slower pace than normal. Eat soft foods that are easy to digest. Take over-the-counter and prescription medicines only as told by your health care provider. Keep all follow-up visits as told by your health care provider. This is  important. Contact a health care provider if: You have blood in your stool 2-3 days after the procedure. Get help right away if you have: More than a small spotting of blood in your stool. Large blood clots in your stool. Swelling of your abdomen. Nausea or vomiting. A fever. Increasing pain in your abdomen that is not relieved with medicine. Summary After the procedure, it is common to have a small amount of blood in your stool. You may also have mild cramping and bloating of your abdomen. If you were given a sedative during the procedure, it can affect you for several hours. Do not drive or operate machinery until your health care provider says that it is safe. Get help right away if you have a lot of blood in your stool, nausea or vomiting, a fever, or increased pain in your abdomen. This  information is not intended to replace advice given to you by your health care provider. Make sure you discuss any questions you have with your health care provider. Document Revised: 08/24/2019 Document Reviewed: 05/14/2019 Elsevier Patient Education  [place] After This sheet gives you information about how to care for yourself after your procedure. Your health care provider may also give you more specific instructions. If you have problems or questions, contact your health care provider. What can I expect after the procedure? After the procedure, it is common to have: Tiredness. Forgetfulness about what happened after the procedure. Impaired judgment for important decisions. Nausea or vomiting. Some difficulty with balance. Follow these instructions at home: For the time period you were told by your health care provider:   Rest as needed. Do not participate in activities where you could fall or become injured. Do not drive or use machinery. Do not drink alcohol. Do not take sleeping pills or medicines that cause drowsiness. Do not make important  decisions or sign legal documents. Do not take care of children on your own. Eating and drinking Follow the diet that is recommended by your health care provider. Drink enough fluid to keep your urine pale yellow. If you vomit: Drink water, juice, or soup when you can drink without vomiting. Make sure you have little or no nausea before eating solid foods. General instructions Have a responsible adult stay with you for the time you are told. It is important to have someone help care for you until you are awake and alert. Take over-the-counter and prescription medicines only as told by your health care provider. If you have sleep apnea, surgery and certain medicines can increase your risk for breathing problems. Follow instructions from your health care provider about wearing your sleep device: Anytime you are sleeping, including during daytime naps. While taking prescription pain medicines, sleeping medicines, or medicines that make you drowsy. Avoid smoking. Keep all follow-up visits as told by your health care provider. This is important. Contact a health care provider if: You keep feeling nauseous or you keep vomiting. You feel light-headed. You are still sleepy or having trouble with balance after 24 hours. You develop a rash. You have a fever. You have redness or swelling around the IV site. Get help right away if: You have trouble breathing. You have new-onset confusion at home. Summary For several hours after your procedure, you may feel tired. You may also be forgetful and have poor judgment. Have a responsible adult stay with you for the time you are told. It is important to have someone help care for you until you are awake and alert. Rest as told. Do not drive or operate machinery. Do not drink alcohol or take sleeping pills. Get help right away if you have trouble breathing, or if you suddenly become confused. This information is not intended to replace advice given to you  by your health care provider. Make sure you discuss any questions you have with your health care provider. Document Revised: 07/03/2020 Document Reviewed: 09/20/2019 Elsevier Patient Education  2022 [employer].

## 2021-11-27 DIAGNOSIS — Z299 Encounter for prophylactic measures, unspecified: Secondary | ICD-10-CM | POA: Diagnosis not present

## 2021-11-27 DIAGNOSIS — R600 Localized edema: Secondary | ICD-10-CM | POA: Diagnosis not present

## 2021-11-27 DIAGNOSIS — M549 Dorsalgia, unspecified: Secondary | ICD-10-CM | POA: Diagnosis not present

## 2021-11-27 DIAGNOSIS — I10 Essential (primary) hypertension: Secondary | ICD-10-CM | POA: Diagnosis not present

## 2021-11-27 DIAGNOSIS — J449 Chronic obstructive pulmonary disease, unspecified: Secondary | ICD-10-CM | POA: Diagnosis not present

## 2021-12-01 ENCOUNTER — Encounter (HOSPITAL_COMMUNITY)
Admission: RE | Admit: 2021-12-01 | Discharge: 2021-12-01 | Disposition: A | Discharge status: Home / Self Care | Payer: Medicare Other | Source: Ambulatory Visit | Attending: Internal Medicine | Admitting: Internal Medicine

## 2021-12-01 ENCOUNTER — Encounter (HOSPITAL_COMMUNITY): Payer: Self-pay

## 2021-12-01 VITALS — BP 120/64 | HR 62 | Temp 97.1°F | Resp 18 | Ht 61.0 in | Wt 142.4 lb

## 2021-12-01 DIAGNOSIS — Z01810 Encounter for preprocedural cardiovascular examination: Secondary | ICD-10-CM | POA: Insufficient documentation

## 2021-12-01 DIAGNOSIS — D649 Anemia, unspecified: Secondary | ICD-10-CM | POA: Diagnosis not present

## 2021-12-01 HISTORY — DX: Unspecified asthma, uncomplicated: J45.909

## 2021-12-03 ENCOUNTER — Ambulatory Visit (HOSPITAL_COMMUNITY): Payer: Medicare Other | Admitting: Anesthesiology

## 2021-12-03 ENCOUNTER — Ambulatory Visit (HOSPITAL_COMMUNITY)
Admission: RE | Admit: 2021-12-03 | Discharge: 2021-12-03 | Disposition: A | Discharge status: Home / Self Care | Payer: Medicare Other | Attending: Internal Medicine | Admitting: Internal Medicine

## 2021-12-03 ENCOUNTER — Encounter (HOSPITAL_COMMUNITY)
Admission: RE | Disposition: A | Discharge status: Home / Self Care | Payer: Self-pay | Source: Home / Self Care | Attending: Internal Medicine

## 2021-12-03 DIAGNOSIS — K2289 Other specified disease of esophagus: Secondary | ICD-10-CM | POA: Diagnosis not present

## 2021-12-03 DIAGNOSIS — D509 Iron deficiency anemia, unspecified: Secondary | ICD-10-CM | POA: Insufficient documentation

## 2021-12-03 DIAGNOSIS — K31A19 Gastric intestinal metaplasia without dysplasia, unspecified site: Secondary | ICD-10-CM | POA: Insufficient documentation

## 2021-12-03 DIAGNOSIS — J449 Chronic obstructive pulmonary disease, unspecified: Secondary | ICD-10-CM | POA: Diagnosis not present

## 2021-12-03 DIAGNOSIS — K319 Disease of stomach and duodenum, unspecified: Secondary | ICD-10-CM | POA: Insufficient documentation

## 2021-12-03 DIAGNOSIS — Z87891 Personal history of nicotine dependence: Secondary | ICD-10-CM | POA: Insufficient documentation

## 2021-12-03 DIAGNOSIS — B182 Chronic viral hepatitis C: Secondary | ICD-10-CM | POA: Diagnosis not present

## 2021-12-03 DIAGNOSIS — K635 Polyp of colon: Secondary | ICD-10-CM

## 2021-12-03 DIAGNOSIS — K760 Fatty (change of) liver, not elsewhere classified: Secondary | ICD-10-CM | POA: Diagnosis not present

## 2021-12-03 DIAGNOSIS — K7460 Unspecified cirrhosis of liver: Secondary | ICD-10-CM | POA: Diagnosis not present

## 2021-12-03 DIAGNOSIS — K2270 Barrett's esophagus without dysplasia: Secondary | ICD-10-CM | POA: Diagnosis not present

## 2021-12-03 DIAGNOSIS — K2970 Gastritis, unspecified, without bleeding: Secondary | ICD-10-CM | POA: Diagnosis not present

## 2021-12-03 HISTORY — PX: COLONOSCOPY WITH PROPOFOL: SHX5780

## 2021-12-03 HISTORY — PX: BIOPSY: SHX5522

## 2021-12-03 HISTORY — PX: ESOPHAGOGASTRODUODENOSCOPY (EGD) WITH PROPOFOL: SHX5813

## 2021-12-03 HISTORY — PX: POLYPECTOMY: SHX5525

## 2021-12-03 SURGERY — COLONOSCOPY WITH PROPOFOL
Anesthesia: General

## 2021-12-03 MED ORDER — PROPOFOL 10 MG/ML IV BOLUS
INTRAVENOUS | Status: DC | PRN
  Administered 2021-12-03 (×2): 50 mg via INTRAVENOUS

## 2021-12-03 MED ORDER — LACTATED RINGERS IV SOLN: INTRAVENOUS | Status: DC | PRN

## 2021-12-03 MED ORDER — OMEPRAZOLE 20 MG PO CPDR: 20.0000 mg | DELAYED_RELEASE_CAPSULE | Freq: Two times a day (BID) | ORAL | 5 refills | Status: DC

## 2021-12-03 MED ORDER — PROPOFOL 500 MG/50ML IV EMUL
INTRAVENOUS | Status: DC | PRN
Start: 2021-12-03 — End: 2021-12-03
  Administered 2021-12-03: 50 ug/kg/min via INTRAVENOUS

## 2021-12-03 MED ORDER — LACTATED RINGERS IV SOLN: INTRAVENOUS | Status: DC

## 2021-12-03 MED ORDER — LIDOCAINE HCL (CARDIAC) PF 100 MG/5ML IV SOSY
PREFILLED_SYRINGE | INTRAVENOUS | Status: DC | PRN
  Administered 2021-12-03: 60 mg via INTRATRACHEAL

## 2021-12-03 NOTE — Anesthesia Preprocedure Evaluation (Signed)
Anesthesia Evaluation  Patient identified by MRN, date of birth, ID band Patient awake    Reviewed: Allergy & Precautions, NPO status , Patient's Chart, lab work & pertinent test results  Airway Mallampati: II  TM Distance: >3 FB Neck ROM: Full    Dental  (+) Edentulous Upper, Edentulous Lower   Pulmonary shortness of breath and with exertion, asthma , COPD,  COPD inhaler, former smoker,    Pulmonary exam normal breath sounds clear to auscultation       Cardiovascular negative cardio ROS Normal cardiovascular exam Rhythm:Regular Rate:Normal     Neuro/Psych negative neurological ROS  negative psych ROS   GI/Hepatic negative GI ROS, (+) Cirrhosis       , Hepatitis -  Endo/Other  negative endocrine ROS  Renal/GU negative Renal ROS  negative genitourinary   Musculoskeletal negative musculoskeletal ROS (+)   Abdominal   Peds negative pediatric ROS (+)  Hematology negative hematology ROS (+)   Anesthesia Other Findings   Reproductive/Obstetrics negative OB ROS                            Anesthesia Physical Anesthesia Plan  ASA: 3  Anesthesia Plan: General   Post-op Pain Management: Minimal or no pain anticipated   Induction: Intravenous  PONV Risk Score and Plan: TIVA  Airway Management Planned: Nasal Cannula and Natural Airway  Additional Equipment:   Intra-op Plan:   Post-operative Plan:   Informed Consent: I have reviewed the patients History and Physical, chart, labs and discussed the procedure including the risks, benefits and alternatives for the proposed anesthesia with the patient or authorized representative who has indicated his/her understanding and acceptance.     Dental advisory given  Plan Discussed with: CRNA and Surgeon  Anesthesia Plan Comments:         Anesthesia Quick Evaluation

## 2021-12-03 NOTE — Op Note (Signed)
[facility] Patient Name: Kaitlin Solis Procedure Date: 12/03/2021 9:51 AM MRN: [id number] Date of Birth: Oct 24, 1956 Attending MD: Elon Alas. Abbey Chatters DO CSN: [id number] Age: 66 Admit Type: Outpatient Procedure:                Colonoscopy Indications:              Iron deficiency anemia Providers:                Elon Alas. Abbey Chatters, DO, Lambert Mody, Hughie Closs RN, RN, Aram Candela Referring MD:              Medicines:                See the Anesthesia note for documentation of the                            administered medications Complications:            No immediate complications. Estimated Blood Loss:     Estimated blood loss was minimal. Procedure:                Pre-Anesthesia Assessment:                           - The anesthesia plan was to use monitored                            anesthesia care (MAC).                           After obtaining informed consent, the colonoscope                            was passed under direct vision. Throughout the                            procedure, the patient's blood pressure, pulse, and                            oxygen saturations were monitored continuously. The                            [id number]-[id number] ([id number]) was introduced through the                            anus and advanced to the the cecum, identified by                            appendiceal orifice and ileocecal valve. The                            colonoscopy was performed without difficulty. The                            patient tolerated the procedure well. The quality  of the bowel preparation was evaluated using the                            BBPS [facility] Bowel Preparation Scale) with scores                            of: Right Colon = 3, Transverse Colon = 3 and Left                            Colon = 3 (entire mucosa seen well with no residual                            staining, small fragments of stool  or opaque                            liquid). The total BBPS score equals 9. Scope In: 9:55:19 AM Scope Out: 10:06:41 AM Scope Withdrawal Time: 0 hours 7 minutes 28 seconds  Total Procedure Duration: 0 hours 11 minutes 22 seconds  Findings:      The perianal and digital rectal examinations were normal.      Two sessile polyps were found in the sigmoid colon. The polyps were 3 to       4 mm in size. These polyps were removed with a cold snare. Resection and       retrieval were complete.      The exam was otherwise without abnormality. Impression:               - Two 3 to 4 mm polyps in the sigmoid colon,                            removed with a cold snare. Resected and retrieved.                           - The examination was otherwise normal. Moderate Sedation:      Per Anesthesia Care Recommendation:           - Patient has a contact number available for                            emergencies. The signs and symptoms of potential                            delayed complications were discussed with the                            patient. Return to normal activities tomorrow.                            Written discharge instructions were provided to the                            patient.                           - Resume previous  diet.                           - Continue present medications.                           - Await pathology results.                           - Repeat colonoscopy in 5-10 years for surveillance                            based on pathology results.                           - Return to GI clinic in 6 months. Procedure Code(s):        --- Professional ---                           [phone], Colonoscopy, flexible; with removal of                            tumor(s), polyp(s), or other lesion(s) by snare                            technique Diagnosis Code(s):        --- Professional ---                           K63.5, Polyp of colon                            D50.9, Iron deficiency anemia, unspecified CPT copyright 2019 American Medical Association. All rights reserved. The codes documented in this report are preliminary and upon coder review may  be revised to meet current compliance requirements. Elon Alas. Abbey Chatters, DO [place] Abbey Chatters, DO 12/03/2021 10:09:20 AM This report has been signed electronically. Number of Addenda: 0

## 2021-12-03 NOTE — Discharge Instructions (Signed)
EGD Discharge instructions Please read the instructions outlined below and refer to this sheet in the next few weeks. These discharge instructions provide you with general information on caring for yourself after you leave the hospital. Your doctor may also give you specific instructions. While your treatment has been planned according to the most current medical practices available, unavoidable complications occasionally occur. If you have any problems or questions after discharge, please call your doctor. ACTIVITY You may resume your regular activity but move at a slower pace for the next 24 hours.  Take frequent rest periods for the next 24 hours.  Walking will help expel (get rid of) the air and reduce the bloated feeling in your abdomen.  No driving for 24 hours (because of the anesthesia (medicine) used during the test).  You may shower.  Do not sign any important legal documents or operate any machinery for 24 hours (because of the anesthesia used during the test).  NUTRITION Drink plenty of fluids.  You may resume your normal diet.  Begin with a light meal and progress to your normal diet.  Avoid alcoholic beverages for 24 hours or as instructed by your caregiver.  MEDICATIONS You may resume your normal medications unless your caregiver tells you otherwise.  WHAT YOU CAN EXPECT TODAY You may experience abdominal discomfort such as a feeling of fullness or gas pains.  FOLLOW-UP Your doctor will discuss the results of your test with you.  SEEK IMMEDIATE MEDICAL ATTENTION IF ANY OF THE FOLLOWING OCCUR: Excessive nausea (feeling sick to your stomach) and/or vomiting.  Severe abdominal pain and distention (swelling).  Trouble swallowing.  Temperature over 101 F (37.8 C).  Rectal bleeding or vomiting of blood.     Colonoscopy Discharge Instructions  Read the instructions outlined below and refer to this sheet in the next few weeks. These discharge instructions provide you with  general information on caring for yourself after you leave the hospital. Your doctor may also give you specific instructions. While your treatment has been planned according to the most current medical practices available, unavoidable complications occasionally occur.   ACTIVITY You may resume your regular activity, but move at a slower pace for the next 24 hours.  Take frequent rest periods for the next 24 hours.  Walking will help get rid of the air and reduce the bloated feeling in your belly (abdomen).  No driving for 24 hours (because of the medicine (anesthesia) used during the test).   Do not sign any important legal documents or operate any machinery for 24 hours (because of the anesthesia used during the test).  NUTRITION Drink plenty of fluids.  You may resume your normal diet as instructed by your doctor.  Begin with a light meal and progress to your normal diet. Heavy or fried foods are harder to digest and may make you feel sick to your stomach (nauseated).  Avoid alcoholic beverages for 24 hours or as instructed.  MEDICATIONS You may resume your normal medications unless your doctor tells you otherwise.  WHAT YOU CAN EXPECT TODAY Some feelings of bloating in the abdomen.  Passage of more gas than usual.  Spotting of blood in your stool or on the toilet paper.  IF YOU HAD POLYPS REMOVED DURING THE COLONOSCOPY: No aspirin products for 7 days or as instructed.  No alcohol for 7 days or as instructed.  Eat a soft diet for the next 24 hours.  FINDING OUT THE RESULTS OF YOUR TEST Not all test results are  available during your visit. If your test results are not back during the visit, make an appointment with your caregiver to find out the results. Do not assume everything is normal if you have not heard from your caregiver or the medical facility. It is important for you to follow up on all of your test results.  SEEK IMMEDIATE MEDICAL ATTENTION IF: You have more than a spotting of  blood in your stool.  Your belly is swollen (abdominal distention).  You are nauseated or vomiting.  You have a temperature over 101.  You have abdominal pain or discomfort that is severe or gets worse throughout the day.   Your EGD revealed moderate amount inflammation in your stomach including 3 small ulcers.  I took biopsies of this to rule out infection with a bacteria called H. pylori.  Await pathology results, my office will contact you.  I also took biopsies of your esophagus to screen for a condition called Barrett's esophagus.  I am going to start you on generic Prilosec 20 mg twice daily for the next 12 weeks.  You can go down to once daily thereafter.  Your colonoscopy revealed 2 small polyp(s) which I removed successfully. Await pathology results, my office will contact you. I recommend repeating colonoscopy in 5-10 years for surveillance purposes depending on pathology.   Otherwise follow-up with GI in 6 months.    I hope you have a great rest of your week!  Elon Alas. Abbey Chatters, D.O. Gastroenterology and Hepatology [facility]

## 2021-12-03 NOTE — Op Note (Signed)
[facility] Patient Name: Kaitlin Solis Procedure Date: 12/03/2021 9:38 AM MRN: [id number] Date of Birth: Jan 15, 1956 Attending MD: Elon Alas. Abbey Chatters DO CSN: [id number] Age: 66 Admit Type: Outpatient Procedure:                Upper GI endoscopy Indications:              Iron deficiency anemia Providers:                Elon Alas. Abbey Chatters, DO, Hughie Closs RN, RN, Lambert Mody, Aram Candela Referring MD:              Medicines:                See the Anesthesia note for documentation of the                            administered medications Complications:            No immediate complications. Estimated Blood Loss:     Estimated blood loss was minimal. Procedure:                Pre-Anesthesia Assessment:                           - The anesthesia plan was to use monitored                            anesthesia care (MAC).                           After obtaining informed consent, the endoscope was                            passed under direct vision. Throughout the                            procedure, the patient's blood pressure, pulse, and                            oxygen saturations were monitored continuously. The                            [id number]-H190 ([id number]) scope was introduced through the                            mouth, and advanced to the second part of duodenum.                            The upper GI endoscopy was accomplished without                            difficulty. The patient tolerated the procedure                            well. Scope In:  9:47:54 AM Scope Out: 9:51:44 AM Total Procedure Duration: 0 hours 3 minutes 50 seconds  Findings:      The esophagus and gastroesophageal junction were examined with white       light and narrow band imaging (NBI) from a forward view and retroflexed       position. There were esophageal mucosal changes suspicious for       short-segment Barrett's esophagus. These changes involved the  mucosa at       the upper extent of the gastric folds (38 cm from the incisors)       extending to the Z-line (36 cm from the incisors). Two tongues of       salmon-colored mucosa were present. The maximum longitudinal extent of       these esophageal mucosal changes was 2 cm in length. Biopsies were taken       with a cold forceps for histology.      Localized moderate inflammation characterized by erosions, erythema and       shallow ulcerations was found in the gastric antrum. Biopsies were taken       with a cold forceps for Helicobacter pylori testing.      The duodenal bulb, first portion of the duodenum and second portion of       the duodenum were normal. Impression:               - Esophageal mucosal changes suspicious for                            short-segment Barrett's esophagus. Biopsied.                           - Gastritis. Biopsied.                           - Normal duodenal bulb, first portion of the                            duodenum and second portion of the duodenum. Moderate Sedation:      Per Anesthesia Care Recommendation:           - Patient has a contact number available for                            emergencies. The signs and symptoms of potential                            delayed complications were discussed with the                            patient. Return to normal activities tomorrow.                            Written discharge instructions were provided to the                            patient.                           - Resume previous  diet.                           - Continue present medications.                           - Await pathology results.                           - Use a proton pump inhibitor PO BID for 12 weeks                            then decrease down to once daily                           - Repeat upper endoscopy in 3 years for                            surveillance based on pathology results.                           -  Return to GI office in 6 months. Procedure Code(s):        --- Professional ---                           [phone], Esophagogastroduodenoscopy, flexible,                            transoral; with biopsy, single or multiple Diagnosis Code(s):        --- Professional ---                           K22.8, Other specified diseases of esophagus                           K29.70, Gastritis, unspecified, without bleeding                           D50.9, Iron deficiency anemia, unspecified CPT copyright 2019 [employer]. All rights reserved. The codes documented in this report are preliminary and upon coder review may  be revised to meet current compliance requirements. Elon Alas. Abbey Chatters, DO [place] Abbey Chatters, DO 12/03/2021 9:54:31 AM This report has been signed electronically. Number of Addenda: 0

## 2021-12-03 NOTE — Interval H&P Note (Signed)
History and Physical Interval Note:  12/03/2021 9:37 AM  Kaitlin Solis  has presented today for surgery, with the diagnosis of ida.  The various methods of treatment have been discussed with the patient and family. After consideration of risks, benefits and other options for treatment, the patient has consented to  Procedure(s) with comments: COLONOSCOPY WITH PROPOFOL (N/A) - 10:15am ESOPHAGOGASTRODUODENOSCOPY (EGD) WITH PROPOFOL (N/A) as a surgical intervention.  The patient's history has been reviewed, patient examined, no change in status, stable for surgery.  I have reviewed the patient's chart and labs.  Questions were answered to the patient's satisfaction.     Eloise Harman

## 2021-12-03 NOTE — Anesthesia Postprocedure Evaluation (Signed)
Anesthesia Post Note  Patient: CAMIE HAUSS  Procedure(s) Performed: COLONOSCOPY WITH PROPOFOL ESOPHAGOGASTRODUODENOSCOPY (EGD) WITH PROPOFOL BIOPSY POLYPECTOMY  Patient location during evaluation: Phase II Anesthesia Type: General Level of consciousness: awake and alert and oriented Pain management: pain level controlled Vital Signs Assessment: post-procedure vital signs reviewed and stable Respiratory status: spontaneous breathing, nonlabored ventilation and respiratory function stable Cardiovascular status: blood pressure returned to baseline and stable Postop Assessment: no apparent nausea or vomiting Anesthetic complications: no   No notable events documented.   Last Vitals:  Vitals:   12/03/21 0920 12/03/21 1012  BP: 130/63 (!) 111/56  Pulse: 62 60  Resp: 18 16  Temp: 36.9 C 36.8 C  SpO2: 98% 100%    Last Pain:  Vitals:   12/03/21 1012  TempSrc: Oral  PainSc: 0-No pain                 Ariq Khamis C Venba Zenner

## 2021-12-03 NOTE — Transfer of Care (Signed)
Immediate Anesthesia Transfer of Care Note  Patient: Kaitlin Solis  Procedure(s) Performed: COLONOSCOPY WITH PROPOFOL ESOPHAGOGASTRODUODENOSCOPY (EGD) WITH PROPOFOL BIOPSY POLYPECTOMY  Patient Location: Short Stay  Anesthesia Type:General  Level of Consciousness: awake, alert  and oriented  Airway & Oxygen Therapy: Patient Spontanous Breathing  Post-op Assessment: Report given to RN and Post -op Vital signs reviewed and stable  Post vital signs: Reviewed and stable  Last Vitals:  Vitals Value Taken Time  BP 111/56 12/03/21 1012  Temp 36.8 C 12/03/21 1012  Pulse 60 12/03/21 1012  Resp 16 12/03/21 1012  SpO2 100 % 12/03/21 1012    Last Pain:  Vitals:   12/03/21 1012  TempSrc: Oral  PainSc: 0-No pain      Patients Stated Pain Goal: 6 (40/33/53 3174)  Complications: No notable events documented.

## 2021-12-04 LAB — SURGICAL PATHOLOGY

## 2021-12-07 ENCOUNTER — Encounter (HOSPITAL_COMMUNITY): Payer: Self-pay | Admitting: Internal Medicine

## 2021-12-08 ENCOUNTER — Telehealth: Payer: Self-pay | Admitting: Internal Medicine

## 2021-12-08 NOTE — Telephone Encounter (Signed)
Pt doesn't understand her results on Mychart regarding her colonoscopy. Please call [phone]

## 2021-12-09 NOTE — Telephone Encounter (Signed)
Patient's results from her procedures are on her path report.  Please let her know these results.  If she has further questions then let me know.  Thank you

## 2021-12-09 NOTE — Telephone Encounter (Signed)
Hey Dr. Margaree Mackintosh The pt looked at her results but does not understand them. Just send to me once looked at and I will call her.

## 2021-12-09 NOTE — Telephone Encounter (Signed)
noted 

## 2021-12-23 DIAGNOSIS — N1831 Chronic kidney disease, stage 3a: Secondary | ICD-10-CM | POA: Diagnosis not present

## 2021-12-23 DIAGNOSIS — Z299 Encounter for prophylactic measures, unspecified: Secondary | ICD-10-CM | POA: Diagnosis not present

## 2021-12-23 DIAGNOSIS — J45909 Unspecified asthma, uncomplicated: Secondary | ICD-10-CM | POA: Diagnosis not present

## 2021-12-23 DIAGNOSIS — J3489 Other specified disorders of nose and nasal sinuses: Secondary | ICD-10-CM | POA: Diagnosis not present

## 2021-12-23 DIAGNOSIS — J449 Chronic obstructive pulmonary disease, unspecified: Secondary | ICD-10-CM | POA: Diagnosis not present

## 2022-01-07 DIAGNOSIS — Z7189 Other specified counseling: Secondary | ICD-10-CM | POA: Diagnosis not present

## 2022-01-07 DIAGNOSIS — Z0000 Encounter for general adult medical examination without abnormal findings: Secondary | ICD-10-CM | POA: Diagnosis not present

## 2022-01-07 DIAGNOSIS — I10 Essential (primary) hypertension: Secondary | ICD-10-CM | POA: Diagnosis not present

## 2022-01-07 DIAGNOSIS — Z299 Encounter for prophylactic measures, unspecified: Secondary | ICD-10-CM | POA: Diagnosis not present

## 2022-01-07 DIAGNOSIS — Z713 Dietary counseling and surveillance: Secondary | ICD-10-CM | POA: Diagnosis not present

## 2022-03-04 DIAGNOSIS — M549 Dorsalgia, unspecified: Secondary | ICD-10-CM | POA: Diagnosis not present

## 2022-03-04 DIAGNOSIS — Z79899 Other long term (current) drug therapy: Secondary | ICD-10-CM | POA: Diagnosis not present

## 2022-03-04 DIAGNOSIS — Z299 Encounter for prophylactic measures, unspecified: Secondary | ICD-10-CM | POA: Diagnosis not present

## 2022-03-04 DIAGNOSIS — R519 Headache, unspecified: Secondary | ICD-10-CM | POA: Diagnosis not present

## 2022-03-04 DIAGNOSIS — I10 Essential (primary) hypertension: Secondary | ICD-10-CM | POA: Diagnosis not present

## 2022-03-05 DIAGNOSIS — M25512 Pain in left shoulder: Secondary | ICD-10-CM | POA: Diagnosis not present

## 2022-03-05 DIAGNOSIS — M79671 Pain in right foot: Secondary | ICD-10-CM | POA: Diagnosis not present

## 2022-03-09 ENCOUNTER — Other Ambulatory Visit: Payer: Self-pay | Admitting: Orthopedic Surgery

## 2022-03-09 ENCOUNTER — Other Ambulatory Visit (HOSPITAL_COMMUNITY): Payer: Self-pay | Admitting: Orthopedic Surgery

## 2022-03-09 DIAGNOSIS — M75100 Unspecified rotator cuff tear or rupture of unspecified shoulder, not specified as traumatic: Secondary | ICD-10-CM

## 2022-05-17 ENCOUNTER — Encounter: Payer: Self-pay | Admitting: Internal Medicine

## 2022-05-20 DIAGNOSIS — Z299 Encounter for prophylactic measures, unspecified: Secondary | ICD-10-CM | POA: Diagnosis not present

## 2022-05-20 DIAGNOSIS — Z79899 Other long term (current) drug therapy: Secondary | ICD-10-CM | POA: Diagnosis not present

## 2022-05-20 DIAGNOSIS — I10 Essential (primary) hypertension: Secondary | ICD-10-CM | POA: Diagnosis not present

## 2022-05-20 DIAGNOSIS — Z0000 Encounter for general adult medical examination without abnormal findings: Secondary | ICD-10-CM | POA: Diagnosis not present

## 2022-05-20 DIAGNOSIS — E7800 Pure hypercholesterolemia, unspecified: Secondary | ICD-10-CM | POA: Diagnosis not present

## 2022-05-20 DIAGNOSIS — R5383 Other fatigue: Secondary | ICD-10-CM | POA: Diagnosis not present

## 2022-05-20 DIAGNOSIS — Z87891 Personal history of nicotine dependence: Secondary | ICD-10-CM | POA: Diagnosis not present

## 2022-05-27 DIAGNOSIS — R5383 Other fatigue: Secondary | ICD-10-CM | POA: Diagnosis not present

## 2022-05-27 DIAGNOSIS — E7800 Pure hypercholesterolemia, unspecified: Secondary | ICD-10-CM | POA: Diagnosis not present

## 2022-05-27 DIAGNOSIS — Z79899 Other long term (current) drug therapy: Secondary | ICD-10-CM | POA: Diagnosis not present

## 2022-05-29 ENCOUNTER — Other Ambulatory Visit: Payer: Self-pay | Admitting: Internal Medicine

## 2022-06-07 DIAGNOSIS — R7303 Prediabetes: Secondary | ICD-10-CM | POA: Diagnosis not present

## 2022-06-07 DIAGNOSIS — M549 Dorsalgia, unspecified: Secondary | ICD-10-CM | POA: Diagnosis not present

## 2022-06-07 DIAGNOSIS — I10 Essential (primary) hypertension: Secondary | ICD-10-CM | POA: Diagnosis not present

## 2022-06-07 DIAGNOSIS — R739 Hyperglycemia, unspecified: Secondary | ICD-10-CM | POA: Diagnosis not present

## 2022-06-07 DIAGNOSIS — Z299 Encounter for prophylactic measures, unspecified: Secondary | ICD-10-CM | POA: Diagnosis not present

## 2022-06-07 DIAGNOSIS — Z79899 Other long term (current) drug therapy: Secondary | ICD-10-CM | POA: Diagnosis not present

## 2022-06-10 DIAGNOSIS — M7741 Metatarsalgia, right foot: Secondary | ICD-10-CM | POA: Diagnosis not present

## 2022-06-10 DIAGNOSIS — M722 Plantar fascial fibromatosis: Secondary | ICD-10-CM | POA: Diagnosis not present

## 2022-06-10 DIAGNOSIS — M79671 Pain in right foot: Secondary | ICD-10-CM | POA: Diagnosis not present

## 2022-07-14 ENCOUNTER — Other Ambulatory Visit: Payer: Self-pay | Admitting: Internal Medicine

## 2022-07-14 DIAGNOSIS — Z1231 Encounter for screening mammogram for malignant neoplasm of breast: Secondary | ICD-10-CM

## 2022-07-15 DIAGNOSIS — M79671 Pain in right foot: Secondary | ICD-10-CM | POA: Diagnosis not present

## 2022-07-15 DIAGNOSIS — M7741 Metatarsalgia, right foot: Secondary | ICD-10-CM | POA: Diagnosis not present

## 2022-07-23 ENCOUNTER — Ambulatory Visit (INDEPENDENT_AMBULATORY_CARE_PROVIDER_SITE_OTHER): Payer: Medicare Other

## 2022-07-23 ENCOUNTER — Ambulatory Visit
Admission: EM | Admit: 2022-07-23 | Discharge: 2022-07-23 | Disposition: A | Discharge status: Home / Self Care | Payer: Medicare Other | Attending: Family Medicine | Admitting: Family Medicine

## 2022-07-23 DIAGNOSIS — M79671 Pain in right foot: Secondary | ICD-10-CM | POA: Diagnosis not present

## 2022-07-23 DIAGNOSIS — M7989 Other specified soft tissue disorders: Secondary | ICD-10-CM | POA: Diagnosis not present

## 2022-07-23 NOTE — ED Provider Notes (Signed)
[facility]    CSN: [id number] Arrival date & time: 07/23/22  1016      History   Chief Complaint Chief Complaint  Patient presents with   Foot Pain    HPI Kaitlin RECH is a 66 y.o. female.   Patient presenting today with right dorsal foot pain at the base of her toes that occurred when she stepped off of a stoop wrong and hit her foot.  She states its been bruised, swollen and very painful to bear weight but no decreased range of motion, numbness, tingling.  Taking tramadol as needed with minimal relief of pain.    Past Medical History:  Diagnosis Date   Asthma    Cirrhosis of liver ([place])    pt states b/c of hep C   COPD (chronic obstructive pulmonary disease) ([facility])    Hepatitis C     There are no problems to display for this patient.   Past Surgical History:  Procedure Laterality Date   ABDOMINAL HYSTERECTOMY     BACK SURGERY  06/2021   BIOPSY  12/03/2021   Procedure: BIOPSY;  Surgeon: Eloise Harman, DO;  Location: [facility];  Service: Endoscopy;;   CHOLECYSTECTOMY     COLONOSCOPY WITH PROPOFOL N/A 12/03/2021   Procedure: COLONOSCOPY WITH PROPOFOL;  Surgeon: Eloise Harman, DO;  Location: [facility];  Service: Endoscopy;  Laterality: N/A;  10:15am   ESOPHAGOGASTRODUODENOSCOPY (EGD) WITH PROPOFOL N/A 12/03/2021   Procedure: ESOPHAGOGASTRODUODENOSCOPY (EGD) WITH PROPOFOL;  Surgeon: Eloise Harman, DO;  Location: [facility];  Service: Endoscopy;  Laterality: N/A;   KNEE SURGERY Right    POLYPECTOMY  12/03/2021   Procedure: POLYPECTOMY;  Surgeon: Eloise Harman, DO;  Location: [facility];  Service: Endoscopy;;   TONSILLECTOMY      OB History   No obstetric history on file.      Home Medications    Prior to Admission medications   Medication Sig Start Date End Date Taking? Authorizing Provider  albuterol (VENTOLIN HFA) 108 (90 Base) MCG/ACT inhaler Inhale 2 puffs into the lungs every 6 (six) hours as needed for wheezing or  shortness of breath.    [provider]  Calcium Carbonate (CALCIUM 500 PO) Take 500 mg by mouth daily.    [provider]  ferrous sulfate 325 (65 FE) MG tablet Take 325 mg by mouth daily with breakfast.    [provider]  Fluticasone-Umeclidin-Vilant (TRELEGY ELLIPTA) 200-62.5-25 MCG/INH AEPB Inhale 1 puff into the lungs daily.    [provider]  furosemide (LASIX) 20 MG tablet Take 20 mg by mouth daily as needed for fluid. 09/29/20   [provider]  hydrochlorothiazide (HYDRODIURIL) 25 MG tablet Take 25 mg by mouth daily. 10/02/20   [provider]  lidocaine (LIDODERM) 5 % Place 1 patch onto the skin daily. Remove & Discard patch within 12 hours or as directed by MD Patient not taking: Reported on 11/25/2021 10/14/20   Maudie Flakes, MD  lisinopril (ZESTRIL) 20 MG tablet Take 20 mg by mouth daily. 10/02/20   [provider]  Multiple Vitamins-Minerals (PRESERVISION AREDS PO) Take by mouth 2 (two) times daily.    [provider]  omeprazole (PRILOSEC) 20 MG capsule Take 1 capsule (20 mg total) by mouth 2 (two) times daily before a meal. Take 30 min before breakfast and 30 min before dinner 06/09/22 06/09/23  Eloise Harman, DO  Potassium 99 MG TABS Take 99 mg  by mouth daily.    [provider]  prednisoLONE acetate (PRED FORTE) 1 % ophthalmic suspension Place 1 drop into the right eye 3 (three) times daily. 11/11/21   [provider]  rOPINIRole (REQUIP) 2 MG tablet Take 2 mg by mouth at bedtime. 09/30/20   [provider]  traMADol (ULTRAM) 50 MG tablet Take 50 mg by mouth 2 (two) times daily as needed for severe pain or moderate pain. 10/09/20   [provider]  traZODone (DESYREL) 50 MG tablet Take 100 mg by mouth at bedtime. 08/18/20   [provider]  vitamin B-12 (CYANOCOBALAMIN) 500 MCG tablet Take 500 mcg by mouth daily.    [provider]    Family  History Family History  Problem Relation Age of Onset   Breast cancer Neg Hx     Social History Social History   Tobacco Use   Smoking status: Former    Years: 40.00    Types: Cigarettes    Quit date: 10/14/2014    Years since quitting: 7.7   Smokeless tobacco: Never  Vaping Use   Vaping Use: Never used  Substance Use Topics   Alcohol use: Never   Drug use: Never     Allergies   Patient has no known allergies.   Review of Systems Review of Systems Per HPI  Physical Exam Triage Vital Signs ED Triage Vitals  Enc Vitals Group     BP 07/23/22 1105 110/64     Pulse Rate 07/23/22 1105 75     Resp 07/23/22 1105 18     Temp 07/23/22 1105 98.2 F (36.8 C)     Temp Source 07/23/22 1105 Oral     SpO2 07/23/22 1105 97 %     Weight --      Height --      Head Circumference --      Peak Flow --      Pain Score 07/23/22 1106 9     Pain Loc --      Pain Edu? --      Excl. in [place]? --    No data found.  Updated Vital Signs BP 110/64 (BP Location: Right Arm)   Pulse 75   Temp 98.2 F (36.8 C) (Oral)   Resp 18   SpO2 97%   Visual Acuity Right Eye Distance:   Left Eye Distance:   Bilateral Distance:    Right Eye Near:   Left Eye Near:    Bilateral Near:     Physical Exam Vitals and nursing note reviewed.  Constitutional:      Appearance: Normal appearance. She is not ill-appearing.  HENT:     Head: Atraumatic.  Eyes:     Extraocular Movements: Extraocular movements intact.     Conjunctiva/sclera: Conjunctivae normal.  Cardiovascular:     Rate and Rhythm: Normal rate and regular rhythm.     Heart sounds: Normal heart sounds.  Pulmonary:     Effort: Pulmonary effort is normal.     Breath sounds: Normal breath sounds.  Musculoskeletal:        General: Swelling, tenderness and signs of injury present. No deformity. Normal range of motion.     Cervical back: Normal range of motion and neck supple.  Skin:    General: Skin is warm and dry.     Findings:  Bruising present.     Comments: Trace bruising to the base of right third through fifth digits and dorsal foot in area of  edema and tenderness to palpation  Neurological:     Mental Status: She is alert and oriented to person, place, and time.     Comments: Right foot neurovascularly intact  Psychiatric:        Mood and Affect: Mood normal.        Thought Content: Thought content normal.        Judgment: Judgment normal.      UC Treatments / Results  Labs (all labs ordered are listed, but only abnormal results are displayed) Labs Reviewed - No data to display  EKG   Radiology DG Foot Complete Right  Result Date: 07/23/2022 CLINICAL DATA:  Right foot pain and swelling EXAM: RIGHT FOOT COMPLETE - 3+ VIEW COMPARISON:  None Available. FINDINGS: There is no evidence of fracture or dislocation. Mild osteoarthritis of the first MTP joint. Small plantar calcaneal spur. Soft tissues are unremarkable. IMPRESSION: No acute fracture or dislocation of the right foot. Mild osteoarthritis of the first MTP joint. Electronically Signed   By: Davina Poke D.O.   On: 07/23/2022 11:26    Procedures Procedures (including critical care time)  Medications Ordered in UC Medications - No data to display  Initial Impression / Assessment and Plan / UC Course  I have reviewed the triage vital signs and the nursing notes.  Pertinent labs & imaging results that were available during my care of the patient were reviewed by me and considered in my medical decision making (see chart for details).     X-ray of the right foot negative for acute bony abnormality.  Discussed RICE protocol, Epsom salt soaks, pain relievers as needed.  Work note given.  Return for worsening symptoms.  Final Clinical Impressions(s) / UC Diagnoses   Final diagnoses:  Right foot pain     Discharge Instructions      Ice, elevate, rest and take over-the-counter pain relievers as needed for your pain.  Epsom salt soaks may  be beneficial as well.  Your x-ray today showed no acute fractures, only chronic arthritis changes were seen    ED Prescriptions   None    PDMP not reviewed this encounter.   Volney American, [place] 07/23/22 1141

## 2022-07-23 NOTE — Discharge Instructions (Signed)
Ice, elevate, rest and take over-the-counter pain relievers as needed for your pain.  Epsom salt soaks may be beneficial as well.  Your x-ray today showed no acute fractures, only chronic arthritis changes were seen

## 2022-07-23 NOTE — ED Triage Notes (Signed)
Pt reports right foot pain and swelling after right foot got stuck with a when trying to stand up from stool x 2 days. Tramadol gives no relief.

## 2022-08-11 ENCOUNTER — Ambulatory Visit
Admission: RE | Admit: 2022-08-11 | Discharge: 2022-08-11 | Disposition: A | Discharge status: Home / Self Care | Payer: Medicare Other | Source: Ambulatory Visit | Attending: Internal Medicine | Admitting: Internal Medicine

## 2022-08-11 DIAGNOSIS — Z1231 Encounter for screening mammogram for malignant neoplasm of breast: Secondary | ICD-10-CM

## 2022-09-06 DIAGNOSIS — Z79899 Other long term (current) drug therapy: Secondary | ICD-10-CM | POA: Diagnosis not present

## 2022-09-06 DIAGNOSIS — Z299 Encounter for prophylactic measures, unspecified: Secondary | ICD-10-CM | POA: Diagnosis not present

## 2022-09-06 DIAGNOSIS — R7303 Prediabetes: Secondary | ICD-10-CM | POA: Diagnosis not present

## 2022-09-06 DIAGNOSIS — I10 Essential (primary) hypertension: Secondary | ICD-10-CM | POA: Diagnosis not present

## 2022-09-06 DIAGNOSIS — M549 Dorsalgia, unspecified: Secondary | ICD-10-CM | POA: Diagnosis not present

## 2022-09-06 DIAGNOSIS — J449 Chronic obstructive pulmonary disease, unspecified: Secondary | ICD-10-CM | POA: Diagnosis not present

## 2022-10-25 ENCOUNTER — Other Ambulatory Visit: Payer: Self-pay | Admitting: Internal Medicine

## 2022-10-25 DIAGNOSIS — G2581 Restless legs syndrome: Secondary | ICD-10-CM

## 2022-11-30 DIAGNOSIS — H04123 Dry eye syndrome of bilateral lacrimal glands: Secondary | ICD-10-CM | POA: Diagnosis not present

## 2022-12-23 DIAGNOSIS — J069 Acute upper respiratory infection, unspecified: Secondary | ICD-10-CM | POA: Diagnosis not present

## 2022-12-23 DIAGNOSIS — R7303 Prediabetes: Secondary | ICD-10-CM | POA: Diagnosis not present

## 2022-12-23 DIAGNOSIS — Z79899 Other long term (current) drug therapy: Secondary | ICD-10-CM | POA: Diagnosis not present

## 2022-12-23 DIAGNOSIS — J449 Chronic obstructive pulmonary disease, unspecified: Secondary | ICD-10-CM | POA: Diagnosis not present

## 2022-12-23 DIAGNOSIS — M549 Dorsalgia, unspecified: Secondary | ICD-10-CM | POA: Diagnosis not present

## 2022-12-23 DIAGNOSIS — I10 Essential (primary) hypertension: Secondary | ICD-10-CM | POA: Diagnosis not present

## 2022-12-23 DIAGNOSIS — Z299 Encounter for prophylactic measures, unspecified: Secondary | ICD-10-CM | POA: Diagnosis not present

## 2023-03-15 DIAGNOSIS — M545 Low back pain, unspecified: Secondary | ICD-10-CM | POA: Diagnosis not present

## 2023-03-15 DIAGNOSIS — M25562 Pain in left knee: Secondary | ICD-10-CM | POA: Diagnosis not present

## 2023-03-15 DIAGNOSIS — M25551 Pain in right hip: Secondary | ICD-10-CM | POA: Diagnosis not present

## 2023-03-29 DIAGNOSIS — M25551 Pain in right hip: Secondary | ICD-10-CM | POA: Diagnosis not present

## 2023-03-29 DIAGNOSIS — M545 Low back pain, unspecified: Secondary | ICD-10-CM | POA: Diagnosis not present

## 2023-03-31 DIAGNOSIS — R52 Pain, unspecified: Secondary | ICD-10-CM | POA: Diagnosis not present

## 2023-03-31 DIAGNOSIS — I10 Essential (primary) hypertension: Secondary | ICD-10-CM | POA: Diagnosis not present

## 2023-03-31 DIAGNOSIS — Z299 Encounter for prophylactic measures, unspecified: Secondary | ICD-10-CM | POA: Diagnosis not present

## 2023-03-31 DIAGNOSIS — Z79899 Other long term (current) drug therapy: Secondary | ICD-10-CM | POA: Diagnosis not present

## 2023-03-31 DIAGNOSIS — J440 Chronic obstructive pulmonary disease with acute lower respiratory infection: Secondary | ICD-10-CM | POA: Diagnosis not present

## 2023-03-31 DIAGNOSIS — J209 Acute bronchitis, unspecified: Secondary | ICD-10-CM | POA: Diagnosis not present

## 2023-04-01 DIAGNOSIS — M545 Low back pain, unspecified: Secondary | ICD-10-CM | POA: Diagnosis not present

## 2023-04-04 DIAGNOSIS — R079 Chest pain, unspecified: Secondary | ICD-10-CM | POA: Diagnosis not present

## 2023-04-04 DIAGNOSIS — Z299 Encounter for prophylactic measures, unspecified: Secondary | ICD-10-CM | POA: Diagnosis not present

## 2023-04-04 DIAGNOSIS — R52 Pain, unspecified: Secondary | ICD-10-CM | POA: Diagnosis not present

## 2023-04-04 DIAGNOSIS — I10 Essential (primary) hypertension: Secondary | ICD-10-CM | POA: Diagnosis not present

## 2023-04-04 DIAGNOSIS — R059 Cough, unspecified: Secondary | ICD-10-CM | POA: Diagnosis not present

## 2023-04-04 DIAGNOSIS — R918 Other nonspecific abnormal finding of lung field: Secondary | ICD-10-CM | POA: Diagnosis not present

## 2023-04-04 DIAGNOSIS — J069 Acute upper respiratory infection, unspecified: Secondary | ICD-10-CM | POA: Diagnosis not present

## 2023-04-04 DIAGNOSIS — J449 Chronic obstructive pulmonary disease, unspecified: Secondary | ICD-10-CM | POA: Diagnosis not present

## 2023-04-13 DIAGNOSIS — M545 Low back pain, unspecified: Secondary | ICD-10-CM | POA: Diagnosis not present

## 2023-04-14 DIAGNOSIS — R52 Pain, unspecified: Secondary | ICD-10-CM | POA: Diagnosis not present

## 2023-04-14 DIAGNOSIS — N1831 Chronic kidney disease, stage 3a: Secondary | ICD-10-CM | POA: Diagnosis not present

## 2023-04-14 DIAGNOSIS — I10 Essential (primary) hypertension: Secondary | ICD-10-CM | POA: Diagnosis not present

## 2023-04-14 DIAGNOSIS — Z299 Encounter for prophylactic measures, unspecified: Secondary | ICD-10-CM | POA: Diagnosis not present

## 2023-04-14 DIAGNOSIS — E1169 Type 2 diabetes mellitus with other specified complication: Secondary | ICD-10-CM | POA: Diagnosis not present

## 2023-04-14 DIAGNOSIS — E7800 Pure hypercholesterolemia, unspecified: Secondary | ICD-10-CM | POA: Diagnosis not present

## 2023-04-14 DIAGNOSIS — J449 Chronic obstructive pulmonary disease, unspecified: Secondary | ICD-10-CM | POA: Diagnosis not present

## 2023-04-29 DIAGNOSIS — M5416 Radiculopathy, lumbar region: Secondary | ICD-10-CM | POA: Diagnosis not present

## 2023-05-27 DIAGNOSIS — R059 Cough, unspecified: Secondary | ICD-10-CM | POA: Diagnosis not present

## 2023-05-27 DIAGNOSIS — I10 Essential (primary) hypertension: Secondary | ICD-10-CM | POA: Diagnosis not present

## 2023-05-27 DIAGNOSIS — I499 Cardiac arrhythmia, unspecified: Secondary | ICD-10-CM | POA: Diagnosis not present

## 2023-05-27 DIAGNOSIS — N1831 Chronic kidney disease, stage 3a: Secondary | ICD-10-CM | POA: Diagnosis not present

## 2023-05-27 DIAGNOSIS — Z299 Encounter for prophylactic measures, unspecified: Secondary | ICD-10-CM | POA: Diagnosis not present

## 2023-05-27 DIAGNOSIS — R5383 Other fatigue: Secondary | ICD-10-CM | POA: Diagnosis not present

## 2023-05-31 DIAGNOSIS — I10 Essential (primary) hypertension: Secondary | ICD-10-CM | POA: Diagnosis not present

## 2023-05-31 DIAGNOSIS — Z299 Encounter for prophylactic measures, unspecified: Secondary | ICD-10-CM | POA: Diagnosis not present

## 2023-05-31 DIAGNOSIS — Z0000 Encounter for general adult medical examination without abnormal findings: Secondary | ICD-10-CM | POA: Diagnosis not present

## 2023-05-31 DIAGNOSIS — Z7189 Other specified counseling: Secondary | ICD-10-CM | POA: Diagnosis not present

## 2023-06-03 DIAGNOSIS — M25512 Pain in left shoulder: Secondary | ICD-10-CM | POA: Diagnosis not present

## 2023-06-03 DIAGNOSIS — M25511 Pain in right shoulder: Secondary | ICD-10-CM | POA: Diagnosis not present

## 2023-06-09 ENCOUNTER — Other Ambulatory Visit: Payer: Self-pay | Admitting: Internal Medicine

## 2023-06-09 ENCOUNTER — Other Ambulatory Visit: Payer: Self-pay

## 2023-06-09 NOTE — Progress Notes (Signed)
error 

## 2023-07-08 DIAGNOSIS — M549 Dorsalgia, unspecified: Secondary | ICD-10-CM | POA: Diagnosis not present

## 2023-07-08 DIAGNOSIS — I10 Essential (primary) hypertension: Secondary | ICD-10-CM | POA: Diagnosis not present

## 2023-07-08 DIAGNOSIS — J449 Chronic obstructive pulmonary disease, unspecified: Secondary | ICD-10-CM | POA: Diagnosis not present

## 2023-07-08 DIAGNOSIS — Z79899 Other long term (current) drug therapy: Secondary | ICD-10-CM | POA: Diagnosis not present

## 2023-07-08 DIAGNOSIS — Z299 Encounter for prophylactic measures, unspecified: Secondary | ICD-10-CM | POA: Diagnosis not present

## 2023-07-28 DIAGNOSIS — E1165 Type 2 diabetes mellitus with hyperglycemia: Secondary | ICD-10-CM | POA: Diagnosis not present

## 2023-07-28 DIAGNOSIS — Z23 Encounter for immunization: Secondary | ICD-10-CM | POA: Diagnosis not present

## 2023-07-28 DIAGNOSIS — E7800 Pure hypercholesterolemia, unspecified: Secondary | ICD-10-CM | POA: Diagnosis not present

## 2023-07-28 DIAGNOSIS — I10 Essential (primary) hypertension: Secondary | ICD-10-CM | POA: Diagnosis not present

## 2023-07-28 DIAGNOSIS — Z299 Encounter for prophylactic measures, unspecified: Secondary | ICD-10-CM | POA: Diagnosis not present

## 2023-07-28 DIAGNOSIS — E1169 Type 2 diabetes mellitus with other specified complication: Secondary | ICD-10-CM | POA: Diagnosis not present

## 2023-08-12 DIAGNOSIS — E7800 Pure hypercholesterolemia, unspecified: Secondary | ICD-10-CM | POA: Diagnosis not present

## 2023-08-12 DIAGNOSIS — Z299 Encounter for prophylactic measures, unspecified: Secondary | ICD-10-CM | POA: Diagnosis not present

## 2023-08-12 DIAGNOSIS — R5383 Other fatigue: Secondary | ICD-10-CM | POA: Diagnosis not present

## 2023-08-12 DIAGNOSIS — R52 Pain, unspecified: Secondary | ICD-10-CM | POA: Diagnosis not present

## 2023-08-12 DIAGNOSIS — Z79899 Other long term (current) drug therapy: Secondary | ICD-10-CM | POA: Diagnosis not present

## 2023-08-12 DIAGNOSIS — Z0000 Encounter for general adult medical examination without abnormal findings: Secondary | ICD-10-CM | POA: Diagnosis not present

## 2023-08-12 DIAGNOSIS — I10 Essential (primary) hypertension: Secondary | ICD-10-CM | POA: Diagnosis not present

## 2023-08-12 IMAGING — MG MM DIGITAL SCREENING BILAT W/ TOMO AND CAD
6 of 10 series · 6 of 30 positions shown · non-contrast
Comparison: None.

CLINICAL DATA: Screening.

EXAM:
DIGITAL SCREENING BILATERAL MAMMOGRAM WITH TOMOSYNTHESIS AND CAD
TECHNIQUE: Bilateral screening digital craniocaudal and mediolateral oblique
mammograms were obtained. Bilateral screening digital breast
tomosynthesis was performed. The images were evaluated with
computer-aided detection.

[R CC synth-2D]
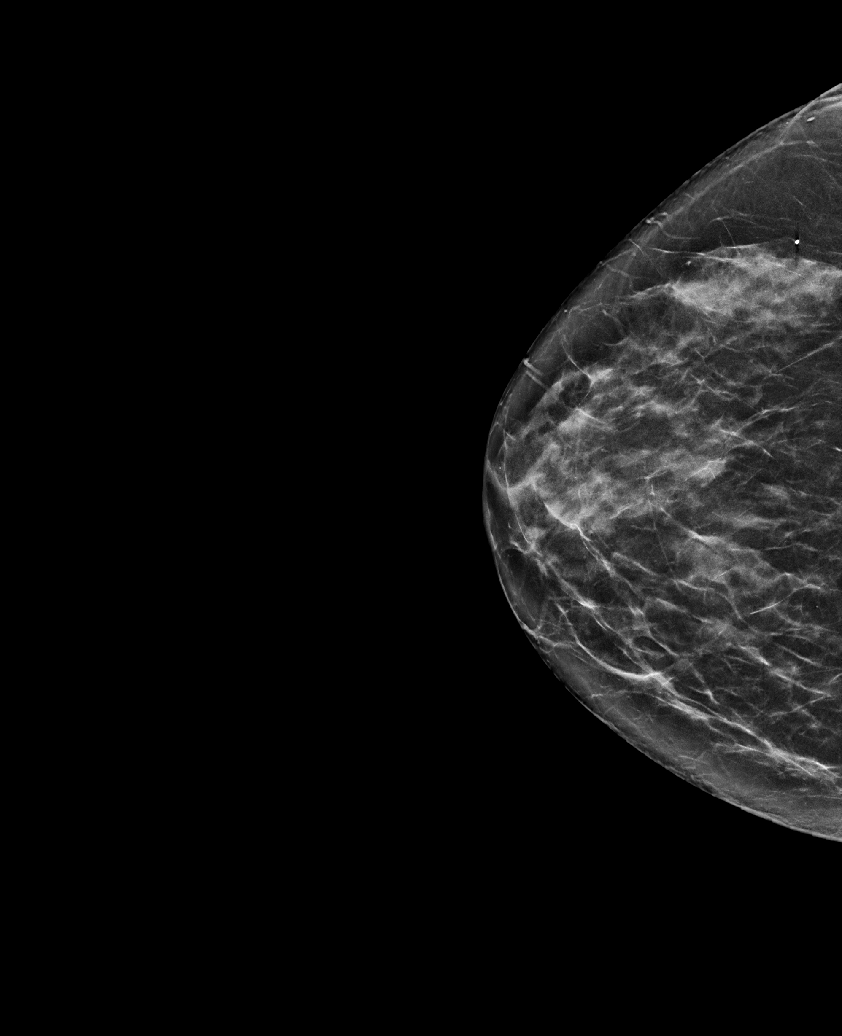

[L CC synth-2D]
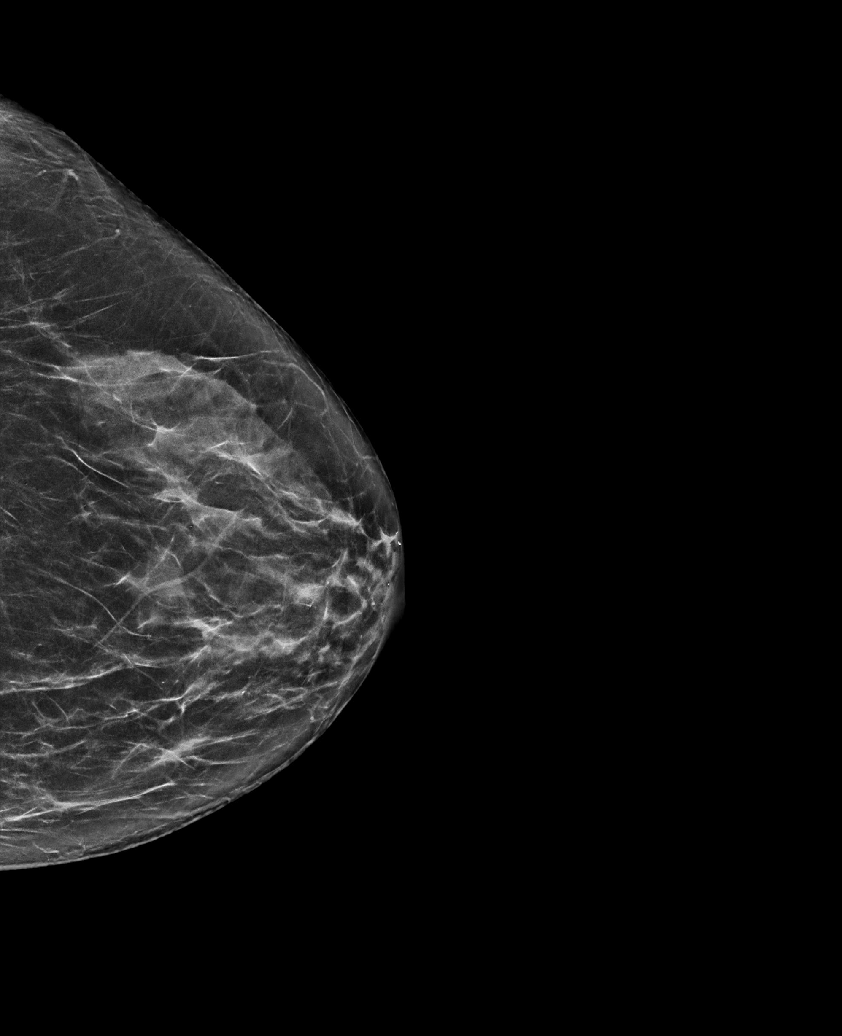

[R XCCL synth-2D]
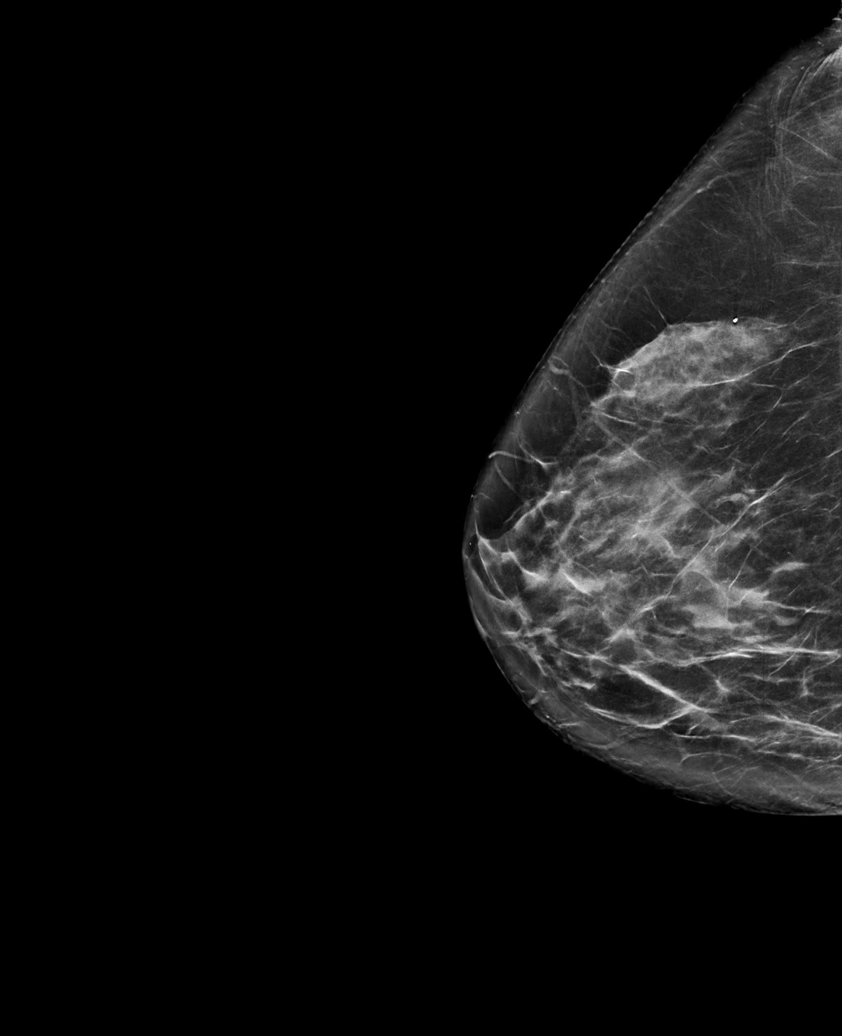

[R MLO synth-2D]
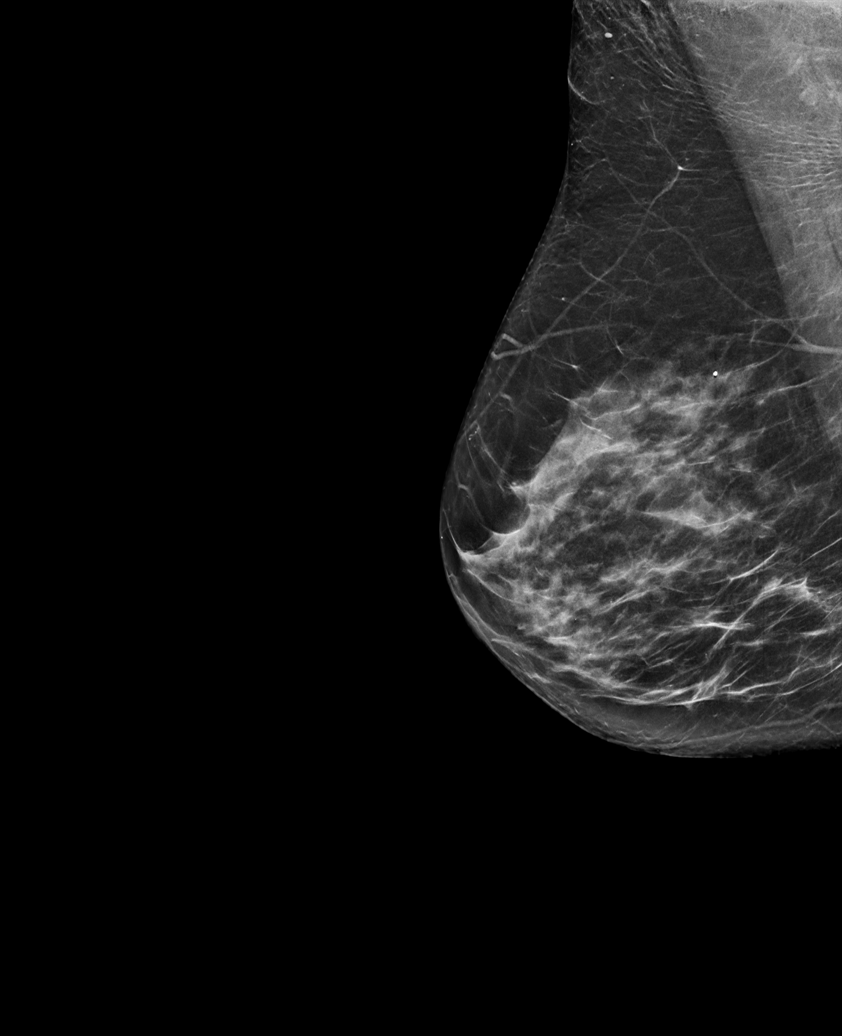

[L MLO synth-2D]
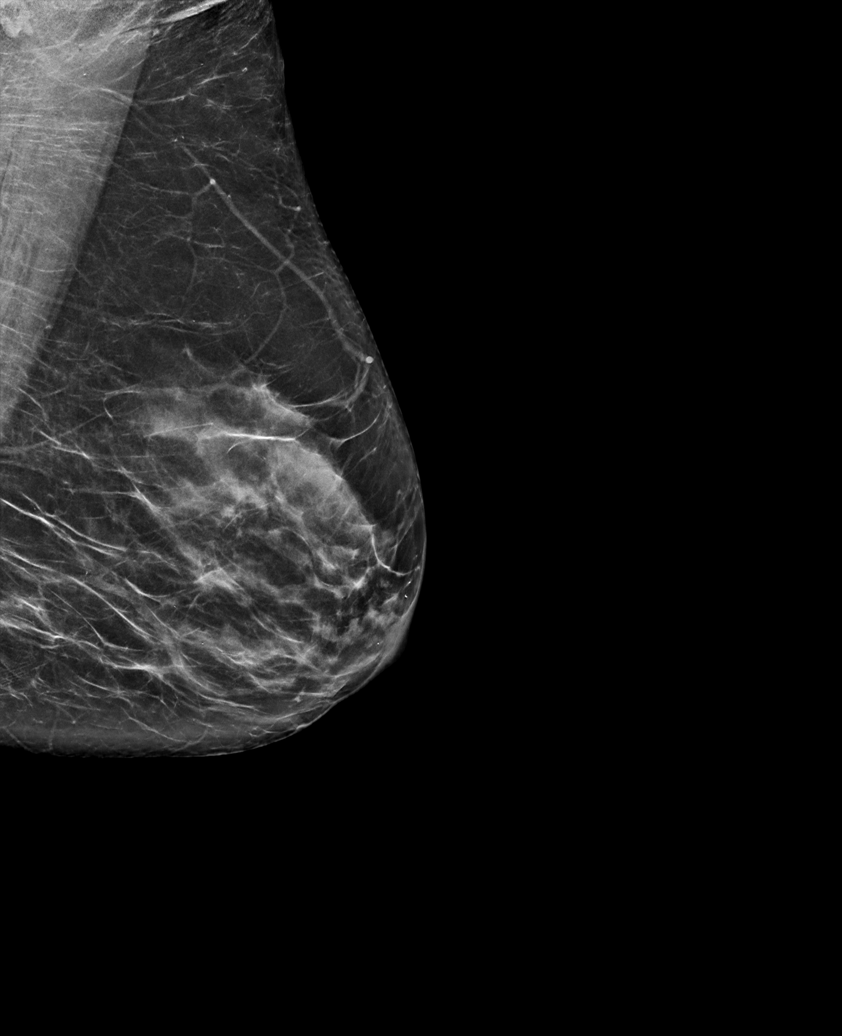

[R CC tomo · tomo slice 33/64.0]
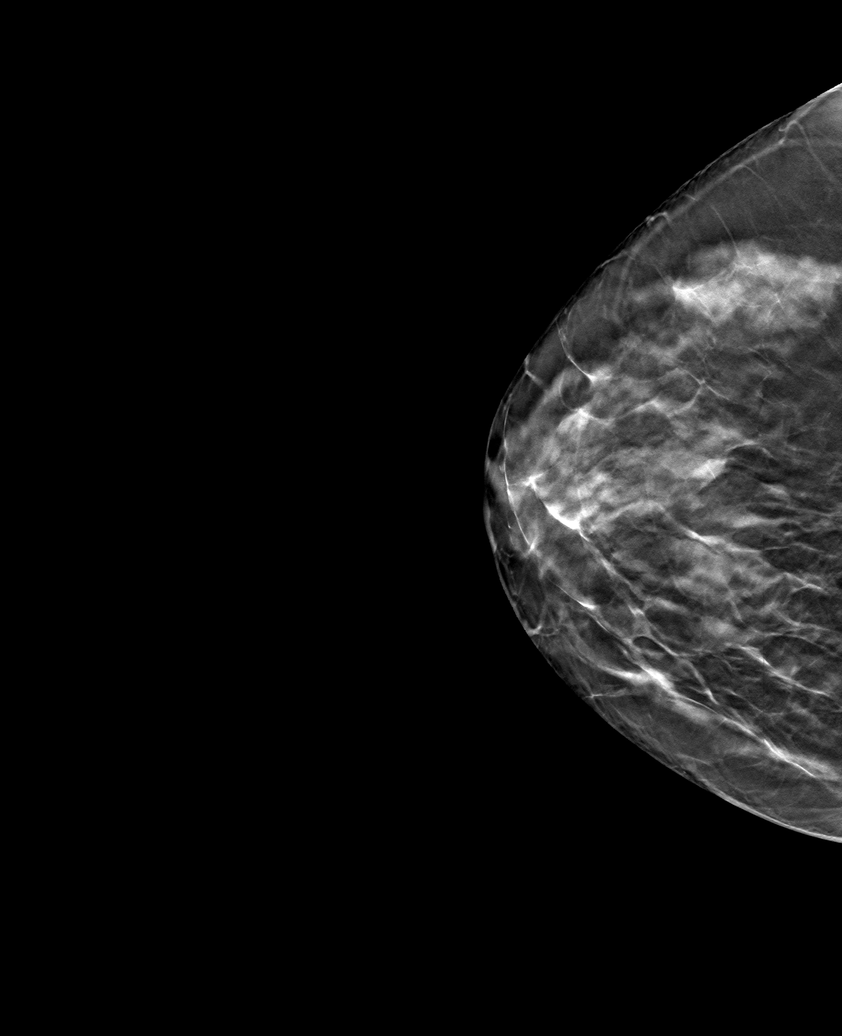

[6 of 30 positions shown; findings below may reference images not displayed]

ACR Breast Density Category c: The breast tissue is heterogeneously
dense, which may obscure small masses
FINDINGS: There are no findings suspicious for malignancy.
IMPRESSION: No mammographic evidence of malignancy. A result letter of this
screening mammogram will be mailed directly to the patient.

RECOMMENDATION:
Screening mammogram in one year. (Code:C8-T-HNK)

BI-RADS CATEGORY  1: Negative.

## 2023-09-10 ENCOUNTER — Other Ambulatory Visit: Payer: Self-pay | Admitting: Internal Medicine

## 2023-11-03 DIAGNOSIS — N1831 Chronic kidney disease, stage 3a: Secondary | ICD-10-CM | POA: Diagnosis not present

## 2023-11-03 DIAGNOSIS — J449 Chronic obstructive pulmonary disease, unspecified: Secondary | ICD-10-CM | POA: Diagnosis not present

## 2023-11-03 DIAGNOSIS — E1169 Type 2 diabetes mellitus with other specified complication: Secondary | ICD-10-CM | POA: Diagnosis not present

## 2023-11-03 DIAGNOSIS — K7460 Unspecified cirrhosis of liver: Secondary | ICD-10-CM | POA: Diagnosis not present

## 2023-11-03 DIAGNOSIS — Z299 Encounter for prophylactic measures, unspecified: Secondary | ICD-10-CM | POA: Diagnosis not present

## 2024-01-12 DIAGNOSIS — M549 Dorsalgia, unspecified: Secondary | ICD-10-CM | POA: Diagnosis not present

## 2024-01-12 DIAGNOSIS — J449 Chronic obstructive pulmonary disease, unspecified: Secondary | ICD-10-CM | POA: Diagnosis not present

## 2024-01-12 DIAGNOSIS — I10 Essential (primary) hypertension: Secondary | ICD-10-CM | POA: Diagnosis not present

## 2024-01-12 DIAGNOSIS — Z299 Encounter for prophylactic measures, unspecified: Secondary | ICD-10-CM | POA: Diagnosis not present

## 2024-01-26 DIAGNOSIS — E1169 Type 2 diabetes mellitus with other specified complication: Secondary | ICD-10-CM | POA: Diagnosis not present

## 2024-01-26 DIAGNOSIS — R809 Proteinuria, unspecified: Secondary | ICD-10-CM | POA: Diagnosis not present

## 2024-01-26 DIAGNOSIS — R52 Pain, unspecified: Secondary | ICD-10-CM | POA: Diagnosis not present

## 2024-01-26 DIAGNOSIS — Z299 Encounter for prophylactic measures, unspecified: Secondary | ICD-10-CM | POA: Diagnosis not present

## 2024-01-26 DIAGNOSIS — N1831 Chronic kidney disease, stage 3a: Secondary | ICD-10-CM | POA: Diagnosis not present

## 2024-01-26 DIAGNOSIS — E1129 Type 2 diabetes mellitus with other diabetic kidney complication: Secondary | ICD-10-CM | POA: Diagnosis not present

## 2024-01-26 DIAGNOSIS — R319 Hematuria, unspecified: Secondary | ICD-10-CM | POA: Diagnosis not present

## 2024-01-26 DIAGNOSIS — E7800 Pure hypercholesterolemia, unspecified: Secondary | ICD-10-CM | POA: Diagnosis not present

## 2024-01-26 DIAGNOSIS — I10 Essential (primary) hypertension: Secondary | ICD-10-CM | POA: Diagnosis not present

## 2024-03-22 DIAGNOSIS — R52 Pain, unspecified: Secondary | ICD-10-CM | POA: Diagnosis not present

## 2024-03-22 DIAGNOSIS — Z299 Encounter for prophylactic measures, unspecified: Secondary | ICD-10-CM | POA: Diagnosis not present

## 2024-03-22 DIAGNOSIS — J309 Allergic rhinitis, unspecified: Secondary | ICD-10-CM | POA: Diagnosis not present

## 2024-03-22 DIAGNOSIS — I10 Essential (primary) hypertension: Secondary | ICD-10-CM | POA: Diagnosis not present

## 2024-03-22 DIAGNOSIS — M549 Dorsalgia, unspecified: Secondary | ICD-10-CM | POA: Diagnosis not present

## 2024-06-15 DIAGNOSIS — I10 Essential (primary) hypertension: Secondary | ICD-10-CM | POA: Diagnosis not present

## 2024-06-15 DIAGNOSIS — Z0000 Encounter for general adult medical examination without abnormal findings: Secondary | ICD-10-CM | POA: Diagnosis not present

## 2024-06-15 DIAGNOSIS — N1831 Chronic kidney disease, stage 3a: Secondary | ICD-10-CM | POA: Diagnosis not present

## 2024-06-15 DIAGNOSIS — Z1389 Encounter for screening for other disorder: Secondary | ICD-10-CM | POA: Diagnosis not present

## 2024-06-15 DIAGNOSIS — Z7189 Other specified counseling: Secondary | ICD-10-CM | POA: Diagnosis not present

## 2024-06-15 DIAGNOSIS — R52 Pain, unspecified: Secondary | ICD-10-CM | POA: Diagnosis not present

## 2024-06-15 DIAGNOSIS — Z299 Encounter for prophylactic measures, unspecified: Secondary | ICD-10-CM | POA: Diagnosis not present

## 2024-07-12 ENCOUNTER — Other Ambulatory Visit: Payer: Self-pay | Admitting: Internal Medicine

## 2024-07-12 DIAGNOSIS — Z1231 Encounter for screening mammogram for malignant neoplasm of breast: Secondary | ICD-10-CM

## 2024-07-24 ENCOUNTER — Ambulatory Visit
Admission: RE | Admit: 2024-07-24 | Discharge: 2024-07-24 | Disposition: A | Discharge status: Home / Self Care | Source: Ambulatory Visit | Attending: Internal Medicine | Admitting: Internal Medicine

## 2024-07-24 DIAGNOSIS — Z1231 Encounter for screening mammogram for malignant neoplasm of breast: Secondary | ICD-10-CM

## 2024-08-16 DIAGNOSIS — Z79899 Other long term (current) drug therapy: Secondary | ICD-10-CM | POA: Diagnosis not present

## 2024-08-16 DIAGNOSIS — R5383 Other fatigue: Secondary | ICD-10-CM | POA: Diagnosis not present

## 2024-08-16 DIAGNOSIS — E7800 Pure hypercholesterolemia, unspecified: Secondary | ICD-10-CM | POA: Diagnosis not present

## 2024-08-21 DIAGNOSIS — I10 Essential (primary) hypertension: Secondary | ICD-10-CM | POA: Diagnosis not present

## 2024-08-21 DIAGNOSIS — Z87891 Personal history of nicotine dependence: Secondary | ICD-10-CM | POA: Diagnosis not present

## 2024-08-21 DIAGNOSIS — Z0000 Encounter for general adult medical examination without abnormal findings: Secondary | ICD-10-CM | POA: Diagnosis not present

## 2024-08-21 DIAGNOSIS — Z23 Encounter for immunization: Secondary | ICD-10-CM | POA: Diagnosis not present

## 2024-08-21 DIAGNOSIS — E7800 Pure hypercholesterolemia, unspecified: Secondary | ICD-10-CM | POA: Diagnosis not present

## 2024-08-21 DIAGNOSIS — R52 Pain, unspecified: Secondary | ICD-10-CM | POA: Diagnosis not present

## 2024-08-21 DIAGNOSIS — J449 Chronic obstructive pulmonary disease, unspecified: Secondary | ICD-10-CM | POA: Diagnosis not present

## 2024-08-21 DIAGNOSIS — E119 Type 2 diabetes mellitus without complications: Secondary | ICD-10-CM | POA: Diagnosis not present

## 2024-08-21 DIAGNOSIS — Z299 Encounter for prophylactic measures, unspecified: Secondary | ICD-10-CM | POA: Diagnosis not present

## 2024-08-21 DIAGNOSIS — N1831 Chronic kidney disease, stage 3a: Secondary | ICD-10-CM | POA: Diagnosis not present

## 2024-11-09 ENCOUNTER — Other Ambulatory Visit: Payer: Self-pay | Admitting: Physician Assistant

## 2024-11-09 DIAGNOSIS — M19011 Primary osteoarthritis, right shoulder: Secondary | ICD-10-CM

## 2024-11-15 ENCOUNTER — Encounter (INDEPENDENT_AMBULATORY_CARE_PROVIDER_SITE_OTHER): Payer: Self-pay | Admitting: *Deleted

## 2024-12-07 ENCOUNTER — Ambulatory Visit: Admission: RE | Admit: 2024-12-07 | Source: Ambulatory Visit

## 2024-12-07 DIAGNOSIS — M19011 Primary osteoarthritis, right shoulder: Secondary | ICD-10-CM
